# Patient Record
Sex: Male | Born: 1970 | Hispanic: No | Marital: Married | Smoking: Former smoker
Health system: Southern US, Community
[De-identification: ages and names within clinical notes are randomized; demographics above are authoritative.]

## PROBLEM LIST (undated history)

## (undated) DIAGNOSIS — I10 Essential (primary) hypertension: Secondary | ICD-10-CM

## (undated) DIAGNOSIS — J45909 Unspecified asthma, uncomplicated: Secondary | ICD-10-CM

## (undated) DIAGNOSIS — E785 Hyperlipidemia, unspecified: Secondary | ICD-10-CM

## (undated) HISTORY — DX: Essential (primary) hypertension: I10

## (undated) HISTORY — DX: Hyperlipidemia, unspecified: E78.5

## (undated) HISTORY — PX: FOOT SURGERY: SHX648

## (undated) HISTORY — DX: Unspecified asthma, uncomplicated: J45.909

---

## 2016-10-01 DIAGNOSIS — R079 Chest pain, unspecified: Secondary | ICD-10-CM | POA: Diagnosis not present

## 2016-10-01 DIAGNOSIS — R0602 Shortness of breath: Secondary | ICD-10-CM | POA: Diagnosis not present

## 2016-10-01 DIAGNOSIS — I10 Essential (primary) hypertension: Secondary | ICD-10-CM | POA: Diagnosis not present

## 2016-11-19 DIAGNOSIS — R0602 Shortness of breath: Secondary | ICD-10-CM | POA: Diagnosis not present

## 2016-11-19 DIAGNOSIS — R0789 Other chest pain: Secondary | ICD-10-CM | POA: Diagnosis not present

## 2016-11-27 DIAGNOSIS — R0602 Shortness of breath: Secondary | ICD-10-CM | POA: Diagnosis not present

## 2016-11-27 DIAGNOSIS — R079 Chest pain, unspecified: Secondary | ICD-10-CM | POA: Diagnosis not present

## 2016-12-13 DIAGNOSIS — I10 Essential (primary) hypertension: Secondary | ICD-10-CM | POA: Diagnosis not present

## 2016-12-13 DIAGNOSIS — R0602 Shortness of breath: Secondary | ICD-10-CM | POA: Diagnosis not present

## 2016-12-13 DIAGNOSIS — R079 Chest pain, unspecified: Secondary | ICD-10-CM | POA: Diagnosis not present

## 2017-01-11 DIAGNOSIS — M222X1 Patellofemoral disorders, right knee: Secondary | ICD-10-CM | POA: Diagnosis not present

## 2017-01-11 DIAGNOSIS — M25561 Pain in right knee: Secondary | ICD-10-CM | POA: Diagnosis not present

## 2017-01-11 DIAGNOSIS — G8929 Other chronic pain: Secondary | ICD-10-CM | POA: Diagnosis not present

## 2017-01-12 DIAGNOSIS — M222X1 Patellofemoral disorders, right knee: Secondary | ICD-10-CM | POA: Diagnosis not present

## 2017-01-19 DIAGNOSIS — M222X1 Patellofemoral disorders, right knee: Secondary | ICD-10-CM | POA: Diagnosis not present

## 2017-02-02 DIAGNOSIS — M222X1 Patellofemoral disorders, right knee: Secondary | ICD-10-CM | POA: Diagnosis not present

## 2017-06-14 DIAGNOSIS — J3089 Other allergic rhinitis: Secondary | ICD-10-CM | POA: Diagnosis not present

## 2017-06-14 DIAGNOSIS — H1045 Other chronic allergic conjunctivitis: Secondary | ICD-10-CM | POA: Diagnosis not present

## 2017-06-14 DIAGNOSIS — Z91013 Allergy to seafood: Secondary | ICD-10-CM | POA: Diagnosis not present

## 2017-09-07 DIAGNOSIS — I10 Essential (primary) hypertension: Secondary | ICD-10-CM | POA: Diagnosis not present

## 2017-09-07 DIAGNOSIS — E7800 Pure hypercholesterolemia, unspecified: Secondary | ICD-10-CM | POA: Diagnosis not present

## 2017-09-16 DIAGNOSIS — M549 Dorsalgia, unspecified: Secondary | ICD-10-CM | POA: Diagnosis not present

## 2017-10-21 DIAGNOSIS — R200 Anesthesia of skin: Secondary | ICD-10-CM | POA: Diagnosis not present

## 2017-11-05 DIAGNOSIS — K149 Disease of tongue, unspecified: Secondary | ICD-10-CM | POA: Diagnosis not present

## 2017-11-05 DIAGNOSIS — J342 Deviated nasal septum: Secondary | ICD-10-CM | POA: Diagnosis not present

## 2017-11-05 DIAGNOSIS — K130 Diseases of lips: Secondary | ICD-10-CM | POA: Diagnosis not present

## 2017-12-02 DIAGNOSIS — R5383 Other fatigue: Secondary | ICD-10-CM | POA: Diagnosis not present

## 2017-12-02 DIAGNOSIS — K219 Gastro-esophageal reflux disease without esophagitis: Secondary | ICD-10-CM | POA: Diagnosis not present

## 2017-12-02 DIAGNOSIS — E7800 Pure hypercholesterolemia, unspecified: Secondary | ICD-10-CM | POA: Diagnosis not present

## 2017-12-02 DIAGNOSIS — R7303 Prediabetes: Secondary | ICD-10-CM | POA: Diagnosis not present

## 2017-12-02 DIAGNOSIS — Z0000 Encounter for general adult medical examination without abnormal findings: Secondary | ICD-10-CM | POA: Diagnosis not present

## 2017-12-02 DIAGNOSIS — I10 Essential (primary) hypertension: Secondary | ICD-10-CM | POA: Diagnosis not present

## 2018-06-09 DIAGNOSIS — E7800 Pure hypercholesterolemia, unspecified: Secondary | ICD-10-CM | POA: Diagnosis not present

## 2018-06-09 DIAGNOSIS — I10 Essential (primary) hypertension: Secondary | ICD-10-CM | POA: Diagnosis not present

## 2018-08-22 DIAGNOSIS — H9313 Tinnitus, bilateral: Secondary | ICD-10-CM | POA: Diagnosis not present

## 2018-08-22 DIAGNOSIS — H6123 Impacted cerumen, bilateral: Secondary | ICD-10-CM | POA: Diagnosis not present

## 2018-09-15 DIAGNOSIS — H9313 Tinnitus, bilateral: Secondary | ICD-10-CM | POA: Diagnosis not present

## 2018-09-15 DIAGNOSIS — H6123 Impacted cerumen, bilateral: Secondary | ICD-10-CM | POA: Diagnosis not present

## 2018-09-15 DIAGNOSIS — J343 Hypertrophy of nasal turbinates: Secondary | ICD-10-CM | POA: Diagnosis not present

## 2018-09-15 DIAGNOSIS — J342 Deviated nasal septum: Secondary | ICD-10-CM | POA: Diagnosis not present

## 2018-10-31 DIAGNOSIS — Z91013 Allergy to seafood: Secondary | ICD-10-CM | POA: Diagnosis not present

## 2018-10-31 DIAGNOSIS — H1045 Other chronic allergic conjunctivitis: Secondary | ICD-10-CM | POA: Diagnosis not present

## 2018-10-31 DIAGNOSIS — J3089 Other allergic rhinitis: Secondary | ICD-10-CM | POA: Diagnosis not present

## 2018-11-23 ENCOUNTER — Other Ambulatory Visit: Payer: Self-pay | Admitting: Cardiology

## 2018-12-06 DIAGNOSIS — I10 Essential (primary) hypertension: Secondary | ICD-10-CM | POA: Insufficient documentation

## 2018-12-06 DIAGNOSIS — E7800 Pure hypercholesterolemia, unspecified: Secondary | ICD-10-CM | POA: Insufficient documentation

## 2018-12-06 NOTE — Progress Notes (Deleted)
Subjective:  Primary Physician:  No primary care provider on file.  Patient ID: Dale Mooney, male    DOB: 09-Nov-1970, 47 y.o.   MRN: [id number]  No chief complaint on file.   HPI: Dale Mooney  is a 48 y.o. male  from [place]. He has returned for follow-up after 18 months. Patient has been feeling well. No complaints of shortness of breath or chest pain since the last office visit. Patient had history of shortness of breath and chest pain in the past but there are no symptoms now.There is no history of orthopnea or PND. He had normal stress nuclear scans and normal echocardiogram in March 2018.  No complaints of palpitation, dizziness, near-syncope or syncope. No history of swelling on the legs and no claudication.  Patient has hypertension, prediabetes and hypercholesterolemia. He is former smoker, quit smoking in 2004. 3 of his cousins on maternal side died from MI, one in mid 40s, one in 21s and another one at age 60 years. One cousin had CABG at age 53 years. 2 other cousins had stent implants in their late 30s or early 83s. Patient drinks one glass of red wine daily on the week days and more on the weekends. He runs for 2 miles, 3 days a week.  No history of thyroid problems. No history of TIA or CVA.    No past medical history on file.  *** The histories are not reviewed yet. Please review them in the "History" navigator section and refresh this SmartLink.  Social History   Socioeconomic History  . Marital status: Not on file    Spouse name: Not on file  . Number of children: Not on file  . Years of education: Not on file  . Highest education level: Not on file  Occupational History  . Not on file  Social Needs  . Financial resource strain: Not on file  . Food insecurity:    Worry: Not on file    Inability: Not on file  . Transportation needs:    Medical: Not on file    Non-medical: Not on file  Tobacco Use  . Smoking status: Not on file  Substance and Sexual  Activity  . Alcohol use: Not on file  . Drug use: Not on file  . Sexual activity: Not on file  Lifestyle  . Physical activity:    Days per week: Not on file    Minutes per session: Not on file  . Stress: Not on file  Relationships  . Social connections:    Talks on phone: Not on file    Gets together: Not on file    Attends religious service: Not on file    Active member of club or organization: Not on file    Attends meetings of clubs or organizations: Not on file    Relationship status: Not on file  . Intimate partner violence:    Fear of current or ex partner: Not on file    Emotionally abused: Not on file    Physically abused: Not on file    Forced sexual activity: Not on file  Other Topics Concern  . Not on file  Social History Narrative  . Not on file    Current Outpatient Medications on File Prior to Visit  Medication Sig Dispense Refill  . metoprolol succinate (TOPROL-XL) 25 MG 24 hr tablet TAKE 1 TABLET BY MOUTH EVERY DAY 90 tablet 2   No current facility-administered medications on file prior to visit.     ***  Review of Systems  Constitutional: Negative for fever.  HENT: Negative for nosebleeds.   Eyes: Negative for blurred vision.  Respiratory: Negative for cough.   Gastrointestinal: Negative for abdominal pain, nausea and vomiting.  Genitourinary: Negative for dysuria.  Musculoskeletal: Negative for myalgias.  Skin: Negative for itching and rash.  Neurological: Negative for dizziness and loss of consciousness.  Endo/Heme/Allergies: Does not bruise/bleed easily.  Psychiatric/Behavioral: The patient is not nervous/anxious.           Objective:  There were no vitals taken for this visit. There is no height or weight on file to calculate BMI.  ***Physical Exam  Constitutional: He is oriented to person, place, and time. He appears well-developed and well-nourished.  HENT:  Head: Normocephalic and atraumatic.  Eyes: Pupils are equal, round, and reactive  to light. Conjunctivae are normal.  Neck: No JVD present. No thyromegaly present.  Cardiovascular: Normal rate, regular rhythm, normal heart sounds and intact distal pulses. Exam reveals no gallop.  No murmur heard. Pulmonary/Chest: Effort normal and breath sounds normal. He has no wheezes. He has no rales.  Abdominal: Soft. Bowel sounds are normal. He exhibits no mass. There is no abdominal tenderness.  Musculoskeletal:        General: No edema.  Lymphadenopathy:    He has no cervical adenopathy.  Neurological: He is alert and oriented to person, place, and time.  Skin: Skin is warm.  Psychiatric: He has a normal mood and affect.  Nursing note and vitals reviewed.   CARDIAC STUDIES:  Echocardiogram [11/27/2016]: Left ventricle cavity is normal in size. Normal global wall motion. Normal diastolic filling pattern, normal LAP. Calculated EF 65%. Left atrial cavity is normal in size. Trace mitral regurgitation. Trace tricuspid regurgitation. Trace pulmonic regurgitation. Normal echocardiogram. Nuclear stress test [11/17/2016]: 1. The resting electrocardiogram demonstrated normal sinus rhythm, normal resting conduction, no resting arrhythmias and normal rest repolarization. The stress electrocardiogram was normal. Patient exercised on Bruce protocol for 12:35 minutes and achieved 13.85 METS. Stress test terminated due to fatigue and 102% MPHR achieved (Target HR >85%). 2. Myocardial perfusion imaging is normal. Overall left ventricular systolic function was normal without regional wall motion abnormalities. The left ventricular ejection fraction was 64%.    Assessment & Recommendations:   There are no diagnoses linked to this encounter.  Recommendation: *** Blood pressure is fairly controlled with therapy. I have advised him to continue all the present medications.  Primary prevention was again discussed. He was advised to follow ADA, low-salt, low-cholesterol diet. He has followed  the diet well and has lost 11 pounds in past 18 months. Patient was complimented and was encouraged to continue diet. Patient was again advised to quit drinking alcohol, he has been drinking heavily on weekends, he was advised not to drink anything more than one glass of wine. Deleterious health effects of alcohol were explained. I have also explained to him that he is on statin therapy and chances of hepatotoxicity may increase if he continues drinking.  He was encouraged to continue with running and do other exercises. Call us if there are any cardiac problems. Return for follow-up after 6 months. Patient told me that he had complete blood tests at his PCP's office 6 months ago, we will get those results.  Earl Many, MD, [facility] 12/06/2018, 3:33 PM [facility] [place] Pager: [phone] Office: [phone] If no answer Cell [phone]

## 2018-12-08 ENCOUNTER — Ambulatory Visit: Payer: Self-pay | Admitting: Cardiology

## 2019-01-12 ENCOUNTER — Telehealth: Payer: Self-pay

## 2019-02-03 ENCOUNTER — Other Ambulatory Visit: Payer: Self-pay | Admitting: Cardiology

## 2019-02-03 MED ORDER — ATORVASTATIN CALCIUM 40 MG PO TABS: 40.0000 mg | ORAL_TABLET | Freq: Every day | ORAL | 1 refills | Status: DC

## 2019-02-09 ENCOUNTER — Other Ambulatory Visit: Payer: Self-pay | Admitting: Cardiology

## 2019-06-08 ENCOUNTER — Ambulatory Visit (INDEPENDENT_AMBULATORY_CARE_PROVIDER_SITE_OTHER): Payer: 59 | Admitting: Cardiology

## 2019-06-08 ENCOUNTER — Encounter: Payer: Self-pay | Admitting: Cardiology

## 2019-06-08 ENCOUNTER — Other Ambulatory Visit: Payer: Self-pay

## 2019-06-08 VITALS — BP 156/95 | HR 56 | Temp 97.8°F | Ht 70.0 in | Wt 187.0 lb

## 2019-06-08 DIAGNOSIS — E7800 Pure hypercholesterolemia, unspecified: Secondary | ICD-10-CM

## 2019-06-08 DIAGNOSIS — I10 Essential (primary) hypertension: Secondary | ICD-10-CM

## 2019-06-08 DIAGNOSIS — Z8249 Family history of ischemic heart disease and other diseases of the circulatory system: Secondary | ICD-10-CM

## 2019-06-08 MED ORDER — LOSARTAN POTASSIUM-HCTZ 50-12.5 MG PO TABS: 1.0000 | ORAL_TABLET | Freq: Every day | ORAL | 1 refills | Status: DC

## 2019-06-08 NOTE — Progress Notes (Signed)
Primary Physician:  Juluis RainierBarnes, Elizabeth, MD   Patient ID: Dale Mooney, male    DOB: 12/22/1970, 48 y.o.   MRN: [id number]  Subjective:    Chief Complaint  Patient presents with  . Hypertension  . Follow-up    1 year    HPI: Dale Hornmit Daye  is a 48 y.o. male  with hypertension, prediabetes, hyperlipidemia, and former tobacco use.  He has significant family history of CAD.  Last seen 1 year ago by Dr. Sherril CroonVyas. Patient has been feeling well, but has recently noted elevated blood pressure. No complaints of shortness of breath or chest pain since the last office visit. There is no history of orthopnea or PND. He had normal stress nuclear scans and normal echocardiogram in March 2018.  No complaints of palpitation, dizziness, near-syncope or syncope. No history of swelling on the legs and no claudication.  3 of his cousins on maternal side died from MI, one in mid 5330s, one in 1240s and another one at age 47 years. One cousin had CABG at age 47 years. 2 other cousins had stent implants in their late 30s or early 1540s. Patient drinks one glass of red wine daily on the week days and more on the weekends. He tries to run 20 miles per week that he tolerates well.   Past Medical History:  Diagnosis Date  . Asthma   . Hyperlipidemia   . Hypertension     Past Surgical History:  Procedure Laterality Date  . FOOT SURGERY Left age 48    Social History   Socioeconomic History  . Marital status: Married    Spouse name: Not on file  . Number of children: 2  . Years of education: Not on file  . Highest education level: Not on file  Occupational History  . Not on file  Social Needs  . Financial resource strain: Not on file  . Food insecurity    Worry: Not on file    Inability: Not on file  . Transportation needs    Medical: Not on file    Non-medical: Not on file  Tobacco Use  . Smoking status: Former Smoker    Packs/day: 0.50    Years: 15.00    Pack years: 7.50    Types: Cigarettes   Quit date: 2004    Years since quitting: 16.7  . Smokeless tobacco: Never Used  Substance and Sexual Activity  . Alcohol use: Yes  . Drug use: Not Currently  . Sexual activity: Not on file  Lifestyle  . Physical activity    Days per week: Not on file    Minutes per session: Not on file  . Stress: Not on file  Relationships  . Social Musicianconnections    Talks on phone: Not on file    Gets together: Not on file    Attends religious service: Not on file    Active member of club or organization: Not on file    Attends meetings of clubs or organizations: Not on file    Relationship status: Not on file  . Intimate partner violence    Fear of current or ex partner: Not on file    Emotionally abused: Not on file    Physically abused: Not on file    Forced sexual activity: Not on file  Other Topics Concern  . Not on file  Social History Narrative  . Not on file    Review of Systems  Constitution: Negative for decreased appetite, malaise/fatigue, weight  gain and weight loss.  Eyes: Negative for visual disturbance.  Cardiovascular: Negative for chest pain, claudication, dyspnea on exertion, leg swelling, orthopnea, palpitations and syncope.  Respiratory: Negative for hemoptysis and wheezing.   Endocrine: Negative for cold intolerance and heat intolerance.  Hematologic/Lymphatic: Does not bruise/bleed easily.  Skin: Negative for nail changes.  Musculoskeletal: Negative for muscle weakness and myalgias.  Gastrointestinal: Negative for abdominal pain, change in bowel habit, nausea and vomiting.  Neurological: Negative for difficulty with concentration, dizziness, focal weakness and headaches.  Psychiatric/Behavioral: Negative for altered mental status and suicidal ideas.  All other systems reviewed and are negative.     Objective:  Blood pressure (!) 156/95, pulse (!) 56, temperature 97.8 F (36.6 C), height 5\' 10"  (1.778 m), weight 187 lb (84.8 kg), SpO2 100 %. Body mass index is 26.83  kg/m.    Physical Exam  Constitutional: He is oriented to person, place, and time. Vital signs are normal. He appears well-developed and well-nourished.  HENT:  Head: Normocephalic and atraumatic.  Neck: Normal range of motion.  Cardiovascular: Normal rate, regular rhythm, normal heart sounds and intact distal pulses.  Pulmonary/Chest: Effort normal and breath sounds normal. No accessory muscle usage. No respiratory distress.  Abdominal: Soft. Bowel sounds are normal.  Musculoskeletal: Normal range of motion.  Neurological: He is alert and oriented to person, place, and time.  Skin: Skin is warm and dry.  Vitals reviewed.  Radiology: No results found.  Laboratory examination:    No flowsheet data found. No flowsheet data found. Lipid Panel  No results found for: CHOL, TRIG, HDL, CHOLHDL, VLDL, LDLCALC, LDLDIRECT HEMOGLOBIN A1C No results found for: HGBA1C, MPG TSH No results for input(s): TSH in the last 8760 hours.  PRN Meds:. There are no discontinued medications. Current Meds  Medication Sig  . atorvastatin (LIPITOR) 40 MG tablet Take 1 tablet (40 mg total) by mouth daily.  Marland Kitchen levocetirizine (XYZAL) 5 MG tablet Take 1 tablet by mouth every evening.  . metoprolol succinate (TOPROL-XL) 25 MG 24 hr tablet TAKE 1 TABLET BY MOUTH EVERY DAY    Cardiac Studies:   Echocardiogram 11/27/2016: Left ventricle cavity is normal in size. Normal global wall motion. Normal diastolic filling pattern, normal LAP. Calculated EF 65%. Left atrial cavity is normal in size. Trace mitral regurgitation. Trace tricuspid regurgitation. Trace pulmonic regurgitation. Normal echocardiogram.  Exercise sestamibi stress test 11/17/2016: 1. The resting electrocardiogram demonstrated normal sinus rhythm, normal resting conduction, no resting arrhythmias and normal rest repolarization. The stress electrocardiogram was normal. Patient exercised on Bruce protocol for 12:35 minutes and achieved 13.85  METS. Stress test terminated due to fatigue and 102% MPHR achieved (Target HR >85%). 2. Myocardial perfusion imaging is normal. Overall left ventricular systolic function was normal without regional wall motion abnormalities. The left ventricular ejection fraction was 64%.  Assessment:   Primary hypertension - Plan: EKG 12-Lead  Hypercholesteremia  Family history of early CAD  EKG 06/08/2019: Sinus bradycardia at 59 bpm, normal axis, no evidence of ischemia.   Recommendations:   Patient is presently doing well without any complaints today except for recently noted elevated blood pressure.  His blood pressure is also elevated in office.  We will add losartan hydrochlorthiazide 50-12.5 mg daily.  He has not had recent labs, will obtain CBC, CMP, lipids, and TSH in 2 weeks for surveillance and also follow-up on kidney function with addition of losartan.  He has been exercising regularly without exertional difficulty.  Has had negative nuclear stress test in  2018.  I discussed given his family history he will need continued aggressive primary/secondary prevention measures.  We will see him back in 6 weeks for follow-up, but encouraged him to contact me sooner if needed.   *I have discussed this case with Dr. Jacinto Halim and he personally examined the patient and participated in formulating the plan.*   Toniann Fail, MSN, APRN, FNP-C [facility] [place] Office: [phone] Fax: [phone]

## 2019-06-22 ENCOUNTER — Telehealth: Payer: Self-pay

## 2019-06-22 DIAGNOSIS — I10 Essential (primary) hypertension: Secondary | ICD-10-CM

## 2019-06-22 MED ORDER — LOSARTAN POTASSIUM-HCTZ 100-25 MG PO TABS: 1.0000 | ORAL_TABLET | ORAL | 2 refills | Status: DC

## 2019-06-22 NOTE — Telephone Encounter (Signed)
Pt called stating his bp is still elevated about 160' and 977'S systolic; Even with the new medication; Please advise

## 2019-06-22 NOTE — Telephone Encounter (Signed)
I have increased the dose of losartan HCT from 50/12.5 mg to 100/25 mg in the morning.  Prescription has been sent, I think that he can double up on the medications he has at home.  He is advised to obtain the labs that have been ordered anytime within the next 1week to 2 weeks.  To call us if blood pressure is not improved within the next 2 weeks.

## 2019-06-23 NOTE — Telephone Encounter (Signed)
Pt aware.

## 2019-06-23 NOTE — Telephone Encounter (Signed)
See above

## 2019-06-30 ENCOUNTER — Other Ambulatory Visit: Payer: Self-pay | Admitting: Cardiology

## 2019-07-14 LAB — CBC
Hematocrit: 43.7 % (ref 37.5–51.0)
Hemoglobin: 14.5 g/dL (ref 13.0–17.7)
MCH: 28 pg (ref 26.6–33.0)
MCHC: 33.2 g/dL (ref 31.5–35.7)
MCV: 84 fL (ref 79–97)
Platelets: 284 10*3/uL (ref 150–450)
RBC: 5.18 x10E6/uL (ref 4.14–5.80)
RDW: 13.3 % (ref 11.6–15.4)
WBC: 6.7 10*3/uL (ref 3.4–10.8)

## 2019-07-14 LAB — COMPREHENSIVE METABOLIC PANEL
ALT: 24 IU/L (ref 0–44)
AST: 26 IU/L (ref 0–40)
Albumin/Globulin Ratio: 1.9 (ref 1.2–2.2)
Albumin: 4.6 g/dL (ref 4.0–5.0)
Alkaline Phosphatase: 55 IU/L (ref 39–117)
BUN/Creatinine Ratio: 22 — ABNORMAL HIGH (ref 9–20)
BUN: 26 mg/dL — ABNORMAL HIGH (ref 6–24)
Bilirubin Total: 0.5 mg/dL (ref 0.0–1.2)
CO2: 24 mmol/L (ref 20–29)
Calcium: 9.6 mg/dL (ref 8.7–10.2)
Chloride: 98 mmol/L (ref 96–106)
Creatinine, Ser: 1.16 mg/dL (ref 0.76–1.27)
GFR calc Af Amer: 86 mL/min/{1.73_m2} (ref >59)
GFR calc non Af Amer: 75 mL/min/{1.73_m2} (ref >59)
Globulin, Total: 2.4 g/dL (ref 1.5–4.5)
Glucose: 108 mg/dL — ABNORMAL HIGH (ref 65–99)
Potassium: 4.2 mmol/L (ref 3.5–5.2)
Sodium: 135 mmol/L (ref 134–144)
Total Protein: 7 g/dL (ref 6.0–8.5)

## 2019-07-14 LAB — LIPID PANEL
Chol/HDL Ratio: 2.5 ratio (ref 0.0–5.0)
Cholesterol, Total: 149 mg/dL (ref 100–199)
HDL: 59 mg/dL (ref >39)
LDL Chol Calc (NIH): 79 mg/dL (ref 0–99)
Triglycerides: 54 mg/dL (ref 0–149)
VLDL Cholesterol Cal: 11 mg/dL (ref 5–40)

## 2019-07-14 LAB — TSH: TSH: 1.88 u[IU]/mL (ref 0.450–4.500)

## 2019-07-20 ENCOUNTER — Ambulatory Visit: Payer: 59 | Admitting: Cardiology

## 2019-07-23 ENCOUNTER — Other Ambulatory Visit: Payer: Self-pay

## 2019-07-23 ENCOUNTER — Ambulatory Visit (INDEPENDENT_AMBULATORY_CARE_PROVIDER_SITE_OTHER): Payer: 59 | Admitting: Cardiology

## 2019-07-23 ENCOUNTER — Encounter: Payer: Self-pay | Admitting: Cardiology

## 2019-07-23 VITALS — BP 138/91 | HR 64 | Temp 98.4°F | Ht 70.0 in | Wt 187.8 lb

## 2019-07-23 DIAGNOSIS — I10 Essential (primary) hypertension: Secondary | ICD-10-CM

## 2019-07-23 DIAGNOSIS — Z8249 Family history of ischemic heart disease and other diseases of the circulatory system: Secondary | ICD-10-CM

## 2019-07-23 DIAGNOSIS — E7800 Pure hypercholesterolemia, unspecified: Secondary | ICD-10-CM

## 2019-07-23 MED ORDER — AMLODIPINE BESYLATE 5 MG PO TABS: 5.0000 mg | ORAL_TABLET | Freq: Every day | ORAL | 2 refills | Status: DC

## 2019-07-23 NOTE — Progress Notes (Signed)
Primary Physician:  Juluis RainierBarnes, Elizabeth, MD   Patient ID: Dale Mooney, male    DOB: 12/19/1970, 48 y.o.   MRN: [id number]  Subjective:    Chief Complaint  Patient presents with  . Hypertension  . Follow-up    lab results    HPI: Dale Mooney  is a 48 y.o. male  with hypertension, prediabetes, hyperlipidemia, and former tobacco use.  He has significant family history of CAD.  Due to recent elevated blood pressure I had change his losartan to losartan HCT, in spite of this he had called our office stating that his blood pressure is high.  He is presently on losartan HCT 100/25 mg in the morning.  He continues to exercise and run at least 20 miles a week and denies any chest discomfort, shortness of breath.  He had normal stress nuclear scans and normal echocardiogram in March 2018.  No complaints of palpitation, dizziness, near-syncope or syncope. No history of swelling on the legs and no claudication.  3 of his cousins on maternal side died from MI, one in mid 2030s, one in 140s and another one at age 47 years. One cousin had CABG at age 47 years. 2 other cousins had stent implants in their late 30s or early 4940s. Patient drinks one glass of red wine daily on the week days and more on the weekends.   Past Medical History:  Diagnosis Date  . Asthma   . Hyperlipidemia   . Hypertension     Past Surgical History:  Procedure Laterality Date  . FOOT SURGERY Left age 48    Social History   Socioeconomic History  . Marital status: Married    Spouse name: Not on file  . Number of children: 2  . Years of education: Not on file  . Highest education level: Not on file  Occupational History  . Not on file  Social Needs  . Financial resource strain: Not on file  . Food insecurity    Worry: Not on file    Inability: Not on file  . Transportation needs    Medical: Not on file    Non-medical: Not on file  Tobacco Use  . Smoking status: Former Smoker    Packs/day: 0.50    Years: 15.00     Pack years: 7.50    Types: Cigarettes    Quit date: 2004    Years since quitting: 16.8  . Smokeless tobacco: Never Used  Substance and Sexual Activity  . Alcohol use: Yes  . Drug use: Not Currently  . Sexual activity: Not on file  Lifestyle  . Physical activity    Days per week: Not on file    Minutes per session: Not on file  . Stress: Not on file  Relationships  . Social Musicianconnections    Talks on phone: Not on file    Gets together: Not on file    Attends religious service: Not on file    Active member of club or organization: Not on file    Attends meetings of clubs or organizations: Not on file    Relationship status: Not on file  . Intimate partner violence    Fear of current or ex partner: Not on file    Emotionally abused: Not on file    Physically abused: Not on file    Forced sexual activity: Not on file  Other Topics Concern  . Not on file  Social History Narrative  . Not on file  Review of Systems  Constitution: Negative for decreased appetite, malaise/fatigue, weight gain and weight loss.  Eyes: Negative for visual disturbance.  Cardiovascular: Negative for chest pain, claudication, dyspnea on exertion, leg swelling, orthopnea, palpitations and syncope.  Respiratory: Negative for hemoptysis and wheezing.   Endocrine: Negative for cold intolerance and heat intolerance.  Hematologic/Lymphatic: Does not bruise/bleed easily.  Skin: Negative for nail changes.  Musculoskeletal: Negative for muscle weakness and myalgias.  Gastrointestinal: Negative for abdominal pain, change in bowel habit, nausea and vomiting.  Neurological: Negative for difficulty with concentration, dizziness, focal weakness and headaches.  Psychiatric/Behavioral: Negative for altered mental status and suicidal ideas.  All other systems reviewed and are negative.     Objective:   Vitals with BMI 07/23/2019 06/08/2019  Height 5\' 10"  5\' 10"   Weight 187 lbs 13 oz 187 lbs  BMI 26.95 26.83   Systolic 138 156  Diastolic 91 95  Pulse 64 56      Physical Exam  Constitutional: He is oriented to person, place, and time. Vital signs are normal. He appears well-developed and well-nourished.  HENT:  Head: Normocephalic and atraumatic.  Neck: Normal range of motion.  Cardiovascular: Normal rate, regular rhythm, normal heart sounds and intact distal pulses.  Pulmonary/Chest: Effort normal and breath sounds normal. No accessory muscle usage. No respiratory distress.  Abdominal: Soft. Bowel sounds are normal.  Musculoskeletal: Normal range of motion.  Neurological: He is alert and oriented to person, place, and time.  Skin: Skin is warm and dry.  Vitals reviewed.  Radiology: No results found.  Laboratory examination:    CMP Latest Ref Rng & Units 07/13/2019  Glucose 65 - 99 mg/dL )  BUN 6 - 24 mg/dL 07/15/2019)  Creatinine 409(W - 1.27 mg/dL 11(B  Sodium 1.47 - 8.29 mmol/L 135  Potassium 3.5 - 5.2 mmol/L 4.2  Chloride 96 - 106 mmol/L 98  CO2 20 - 29 mmol/L 24  Calcium 8.7 - 10.2 mg/dL 9.6  Total Protein 6.0 - 8.5 g/dL 7.0  Total Bilirubin 0.0 - 1.2 mg/dL 0.5  Alkaline Phos 39 - 117 IU/L 55  AST 0 - 40 IU/L 26  ALT 0 - 44 IU/L 24   CBC Latest Ref Rng & Units 07/13/2019  WBC 3.4 - 10.8 x10E3/uL 6.7  Hemoglobin 13.0 - 17.7 g/dL 130  Hematocrit 07/15/2019 - 51.0 % 43.7  Platelets 150 - 450 x10E3/uL 284   Lipid Panel     Component Value Date/Time   CHOL 149 07/13/2019 0900   TRIG 54 07/13/2019 0900   HDL 59 07/13/2019 0900   CHOLHDL 2.5 07/13/2019 0900   LDLCALC 79 07/13/2019 0900   HEMOGLOBIN A1C No results found for: HGBA1C, MPG TSH Recent Labs    07/13/19 0900  TSH 1.880    PRN Meds:. Medications Discontinued During This Encounter  Medication Reason  . metoprolol succinate (TOPROL-XL) 25 MG 24 hr tablet Error   Current Meds  Medication Sig  . atorvastatin (LIPITOR) 40 MG tablet Take 1 tablet (40 mg total) by mouth daily.  07/15/2019 levocetirizine (XYZAL) 5 MG  tablet Take 1 tablet by mouth every evening.  07/15/19 losartan-hydrochlorothiazide (HYZAAR) 100-25 MG tablet Take 1 tablet by mouth every morning.    Cardiac Studies:   Echocardiogram 11/27/2016: Left ventricle cavity is normal in size. Normal global wall motion. Normal diastolic filling pattern, normal LAP. Calculated EF 65%. Left atrial cavity is normal in size. Trace mitral regurgitation. Trace tricuspid regurgitation. Trace pulmonic regurgitation. Normal echocardiogram.  Exercise sestamibi stress  test 11/17/2016: 1. The resting electrocardiogram demonstrated normal sinus rhythm, normal resting conduction, no resting arrhythmias and normal rest repolarization. The stress electrocardiogram was normal. Patient exercised on Bruce protocol for 12:35 minutes and achieved 13.85 METS. Stress test terminated due to fatigue and 102% MPHR achieved (Target HR >85%). 2. Myocardial perfusion imaging is normal. Overall left ventricular systolic function was normal without regional wall motion abnormalities. The left ventricular ejection fraction was 64%.  Assessment:     ICD-10-CM   1. Primary hypertension  I10 amLODipine (NORVASC) 5 MG tablet  2. Hypercholesteremia  E78.00   3. Family history of early CAD  Z82.49     EKG 06/08/2019: Sinus bradycardia at 59 bpm, normal axis, no evidence of ischemia.   Recommendations:   Patient is here on a 6 week office visit and follow-up of hypertension, hyperlipidemia.  Patient continues to have elevated blood pressure, will add amlodipine 5 mg daily.  I discussed with him regarding hyperglycemia as well.  Lipids are under excellent control.  Labs were reviewed.  Unless he has new symptoms of chest pain or dyspnea or any other cardiac symptoms, I'll see him back in a year for follow-up of hypertension and hyperlipidemia.  In view of strong family history of premature coronary disease we would be aggressive in risk factor modification.  Adrian Prows, MD,  [facility] 07/23/2019, 3:57 PM [facility] [place] Pager: [phone] Office: [phone] If no answer Cell [phone]

## 2019-09-21 ENCOUNTER — Other Ambulatory Visit: Payer: Self-pay | Admitting: Cardiology

## 2019-09-21 DIAGNOSIS — I10 Essential (primary) hypertension: Secondary | ICD-10-CM

## 2019-10-02 ENCOUNTER — Other Ambulatory Visit: Payer: Self-pay | Admitting: Cardiology

## 2019-10-02 DIAGNOSIS — I10 Essential (primary) hypertension: Secondary | ICD-10-CM

## 2020-06-07 ENCOUNTER — Other Ambulatory Visit: Payer: Self-pay | Admitting: Family Medicine

## 2020-06-07 DIAGNOSIS — R221 Localized swelling, mass and lump, neck: Secondary | ICD-10-CM

## 2020-06-09 ENCOUNTER — Other Ambulatory Visit: Payer: Self-pay

## 2020-06-09 ENCOUNTER — Ambulatory Visit (INDEPENDENT_AMBULATORY_CARE_PROVIDER_SITE_OTHER): Payer: Managed Care, Other (non HMO)

## 2020-06-09 DIAGNOSIS — R221 Localized swelling, mass and lump, neck: Secondary | ICD-10-CM

## 2020-07-22 ENCOUNTER — Ambulatory Visit: Payer: 59 | Admitting: Cardiology

## 2020-08-02 ENCOUNTER — Ambulatory Visit (INDEPENDENT_AMBULATORY_CARE_PROVIDER_SITE_OTHER): Payer: Managed Care, Other (non HMO)

## 2020-08-02 ENCOUNTER — Other Ambulatory Visit: Payer: Self-pay | Admitting: Family Medicine

## 2020-08-02 ENCOUNTER — Other Ambulatory Visit: Payer: Self-pay

## 2020-08-02 DIAGNOSIS — R222 Localized swelling, mass and lump, trunk: Secondary | ICD-10-CM | POA: Diagnosis not present

## 2020-08-02 DIAGNOSIS — R2230 Localized swelling, mass and lump, unspecified upper limb: Secondary | ICD-10-CM

## 2020-09-16 ENCOUNTER — Emergency Department: Admit: 2020-09-16 | Discharge status: Absent without leave | Payer: Self-pay

## 2020-10-04 ENCOUNTER — Other Ambulatory Visit: Payer: Self-pay | Admitting: Cardiology

## 2020-10-04 DIAGNOSIS — I10 Essential (primary) hypertension: Secondary | ICD-10-CM

## 2020-10-25 ENCOUNTER — Other Ambulatory Visit: Payer: Self-pay | Admitting: Cardiology

## 2020-10-25 DIAGNOSIS — I10 Essential (primary) hypertension: Secondary | ICD-10-CM

## 2020-10-26 ENCOUNTER — Other Ambulatory Visit: Payer: Self-pay

## 2020-10-26 DIAGNOSIS — I10 Essential (primary) hypertension: Secondary | ICD-10-CM

## 2020-10-26 MED ORDER — LOSARTAN POTASSIUM-HCTZ 100-25 MG PO TABS: ORAL_TABLET | ORAL | 3 refills | Status: DC

## 2020-10-26 MED ORDER — ATORVASTATIN CALCIUM 40 MG PO TABS: 40.0000 mg | ORAL_TABLET | Freq: Every day | ORAL | 3 refills | Status: DC

## 2020-11-07 ENCOUNTER — Encounter: Payer: Self-pay | Admitting: Cardiology

## 2020-11-07 ENCOUNTER — Other Ambulatory Visit: Payer: Self-pay

## 2020-11-07 ENCOUNTER — Ambulatory Visit: Payer: 59 | Admitting: Cardiology

## 2020-11-07 VITALS — BP 123/77 | HR 66 | Temp 98.3°F | Resp 17 | Ht 70.0 in | Wt 198.8 lb

## 2020-11-07 DIAGNOSIS — Z8249 Family history of ischemic heart disease and other diseases of the circulatory system: Secondary | ICD-10-CM

## 2020-11-07 DIAGNOSIS — E7800 Pure hypercholesterolemia, unspecified: Secondary | ICD-10-CM

## 2020-11-07 DIAGNOSIS — I10 Essential (primary) hypertension: Secondary | ICD-10-CM

## 2020-11-07 DIAGNOSIS — R739 Hyperglycemia, unspecified: Secondary | ICD-10-CM

## 2020-11-07 NOTE — Progress Notes (Signed)
Primary Physician:  Leighton Ruff, MD   Patient ID: Randen Kauth, male    DOB: Jul 23, 1971, 50 y.o.   MRN: [id number]  Subjective:    Chief Complaint  Patient presents with  . Follow-up  . Hypertension  . Hyperlipidemia    HPI: Oseph Imburgia  is a 50 y.o. male  with hypertension, prediabetes, hyperlipidemia, and former tobacco use.  He has significant family history of CAD, on the maternal side he has had multiple cousins or uncles who have had significant CAD at age <34, with multiple cardiac deaths at age <21.  Due to recent elevated blood pressure I had change his losartan to losartan HCT, in spite of this he had called our office stating that his blood pressure is high.  He is presently on losartan HCT 100/25 mg in the morning.  He continues to exercise and run at least 20 miles a week and denies any chest discomfort, shortness of breath.  He had normal stress nuclear scans and normal echocardiogram in March 2018.  No complaints of palpitation, dizziness, near-syncope or syncope. No history of swelling on the legs and no claudication.  3 of his cousins on maternal side died from MI, one in mid 5s, one in 94s and another one at age 66 years. One cousin had CABG at age 43 years. 2 other cousins had stent implants in their late 59s or early 63s. Patient drinks one glass of red wine daily on the week days and more on the weekends.   Past Medical History:  Diagnosis Date  . Asthma   . Hyperlipidemia   . Hypertension     Past Surgical History:  Procedure Laterality Date  . FOOT SURGERY Left age 81   Social History   Tobacco Use  . Smoking status: Former Smoker    Packs/day: 0.50    Years: 15.00    Pack years: 7.50    Types: Cigarettes    Quit date: 2004    Years since quitting: 18.1  . Smokeless tobacco: Never Used  Substance Use Topics  . Alcohol use: Yes    Alcohol/week: 24.0 standard drinks    Types: 12 Glasses of wine, 12 Standard drinks or equivalent per week     Comment: More than occasionally   Marital Status: Married    Review of Systems  Cardiovascular: Negative for chest pain, dyspnea on exertion and leg swelling.  Gastrointestinal: Negative for melena.   Objective:   Vitals with BMI 11/07/2020 07/23/2019 06/08/2019  Height 5' 10"  5' 10"  5' 10"   Weight 198 lbs 13 oz 187 lbs 13 oz 187 lbs  BMI 28.52 67.20 94.70  Systolic 962 836 629  Diastolic 77 91 95  Pulse 66 64 56      Physical Exam Vitals reviewed.  Constitutional:      Appearance: He is well-developed.  HENT:     Head: Normocephalic and atraumatic.  Cardiovascular:     Rate and Rhythm: Normal rate and regular rhythm.     Pulses: Intact distal pulses.     Heart sounds: Normal heart sounds.  Pulmonary:     Effort: Pulmonary effort is normal. No accessory muscle usage or respiratory distress.     Breath sounds: Normal breath sounds.  Abdominal:     General: Bowel sounds are normal.     Palpations: Abdomen is soft.  Musculoskeletal:        General: Normal range of motion.     Cervical back: Normal range of motion.  Skin:    General: Skin is warm and dry.  Neurological:     Mental Status: He is alert and oriented to person, place, and time.    Radiology: No results found.  Laboratory examination:   CMP Latest Ref Rng & Units 07/13/2019  Glucose 65 - 99 mg/dL 108(H)  BUN 6 - 24 mg/dL 26(H)  Creatinine 0.76 - 1.27 mg/dL 1.16  Sodium 134 - 144 mmol/L 135  Potassium 3.5 - 5.2 mmol/L 4.2  Chloride 96 - 106 mmol/L 98  CO2 20 - 29 mmol/L 24  Calcium 8.7 - 10.2 mg/dL 9.6  Total Protein 6.0 - 8.5 g/dL 7.0  Total Bilirubin 0.0 - 1.2 mg/dL 0.5  Alkaline Phos 39 - 117 IU/L 55  AST 0 - 40 IU/L 26  ALT 0 - 44 IU/L 24   CBC Latest Ref Rng & Units 07/13/2019  WBC 3.4 - 10.8 x10E3/uL 6.7  Hemoglobin 13.0 - 17.7 g/dL 14.5  Hematocrit 37.5 - 51.0 % 43.7  Platelets 150 - 450 x10E3/uL 284   Lipid Panel     Component Value Date/Time   CHOL 149 07/13/2019 0900   TRIG 54  07/13/2019 0900   HDL 59 07/13/2019 0900   CHOLHDL 2.5 07/13/2019 0900   LDLCALC 79 07/13/2019 0900   External labs:   Labs 08/09/2020:  A1c 6.0%.  TSH normal.  Hb 14.5/HCT 41.6, platelets 253, normal indicis.  BUN 13, creatinine 1.05, EGFR 75/91 mL, potassium 4.6, CMP otherwise normal.  Total cholesterol 169, triglycerides 105, HDL 49, NHDL 120, LDL 101.  Current Outpatient Medications on File Prior to Visit  Medication Sig Dispense Refill  . amLODipine (NORVASC) 5 MG tablet TAKE 1 TABLET BY MOUTH EVERY DAY 90 tablet 3  . atorvastatin (LIPITOR) 40 MG tablet Take 1 tablet (40 mg total) by mouth daily. 30 tablet 3  . cetirizine (ZYRTEC) 10 MG tablet Take 1 tablet by mouth daily.    Marland Kitchen ibuprofen (ADVIL) 200 MG tablet Take 1 tablet by mouth as needed.    Marland Kitchen levocetirizine (XYZAL) 5 MG tablet Take 1 tablet by mouth every evening.    Marland Kitchen losartan-hydrochlorothiazide (HYZAAR) 100-25 MG tablet TAKE 1 TABLET BY MOUTH EVERY DAY IN THE MORNING 30 tablet 3   No current facility-administered medications on file prior to visit.    Cardiac Studies:   Echocardiogram 11/27/2016: Left ventricle cavity is normal in size. Normal global wall motion. Normal diastolic filling pattern, normal LAP. Calculated EF 65%. Left atrial cavity is normal in size. Trace mitral regurgitation. Trace tricuspid regurgitation. Trace pulmonic regurgitation. Normal echocardiogram.  Exercise sestamibi stress test 11/17/2016: 1. The resting electrocardiogram demonstrated normal sinus rhythm, normal resting conduction, no resting arrhythmias and normal rest repolarization. The stress electrocardiogram was normal. Patient exercised on Bruce protocol for 12:35 minutes and achieved 13.85 METS. Stress test terminated due to fatigue and 102% MPHR achieved (Target HR >85%). 2. Myocardial perfusion imaging is normal. Overall left ventricular systolic function was normal without regional wall motion abnormalities. The left  ventricular ejection fraction was 64%.  EKG:   EKG 11/07/2020: Normal sinus rhythm at rate of 63 bpm, normal axis.  No evidence of ischemia, normal EKG. no change from 06/08/2019.  Assessment:     ICD-10-CM   1. Essential hypertension  I10 EKG 12-Lead  2. Hypercholesteremia  E78.00 CT CARDIAC SCORING (SELF PAY ONLY)  3. Family history of early CAD  Z47.49 CT CARDIAC SCORING (SELF PAY ONLY)  4. Hyperglycemia  R73.9  Recommendations:   Anna Livers is a 50 y.o.  with hypertension, prediabetes, hyperlipidemia, and former tobacco use.  He has significant family history of CAD, on the maternal side he has had multiple cousins or uncles who have had significant CAD at age <27, with multiple cardiac deaths at age <75.  He remains asymptomatic however he has gained about 10 to 11 pounds in weight and he has not been exercising since pandemic.  I reviewed his external labs, he now has developed clearly hyperglycemia as well and prediabetic state.  Lipids are also increased.  In view of strong family still premature artery disease, I have recommended coronary calcium score for stratification.  With regard to hypertension, on appropriate medical therapy and blood pressure is well controlled.  He still is continuing to exercise but not as avidly as he was doing past year.  We have discussed regarding weight loss and resuming his exercise.  As he remains asymptomatic I have not recommended any other testing unless coronary calcium score comes on to be high risk.  I have a very low threshold to start him on a statin therapy depending upon his coronary calcium score.  Office visit in 1 year.   Adrian Prows, MD, [facility] 11/07/2020, 10:20 AM Office: [phone] Pager: [phone]

## 2020-11-09 ENCOUNTER — Other Ambulatory Visit: Payer: Self-pay | Admitting: Cardiology

## 2020-11-09 DIAGNOSIS — I10 Essential (primary) hypertension: Secondary | ICD-10-CM

## 2020-11-09 NOTE — Telephone Encounter (Signed)
ICD-10-CM   1. Primary hypertension  I10 valsartan-hydrochlorothiazide (DIOVAN HCT) 160-25 MG tablet   Medications Discontinued During This Encounter  Medication Reason  . losartan-hydrochlorothiazide (HYZAAR) 100-25 MG tablet     Meds ordered this encounter  Medications  . valsartan-hydrochlorothiazide (DIOVAN HCT) 160-25 MG tablet    Sig: Take 1 tablet by mouth every morning.    Dispense:  90 tablet    Refill:  0    Losartan Backorder.   Yates Decamp, MD, [facility] 11/09/2020, 11:29 AM Office: [phone] Pager: [phone]

## 2020-11-09 NOTE — Telephone Encounter (Signed)
From pharmacy

## 2020-11-14 ENCOUNTER — Other Ambulatory Visit: Payer: Self-pay | Admitting: Otolaryngology

## 2020-11-14 DIAGNOSIS — R609 Edema, unspecified: Secondary | ICD-10-CM

## 2020-11-28 ENCOUNTER — Other Ambulatory Visit: Payer: 59

## 2020-12-06 ENCOUNTER — Other Ambulatory Visit: Payer: Self-pay

## 2020-12-06 DIAGNOSIS — I10 Essential (primary) hypertension: Secondary | ICD-10-CM

## 2020-12-06 MED ORDER — LOSARTAN POTASSIUM-HCTZ 100-25 MG PO TABS: 1.0000 | ORAL_TABLET | Freq: Every morning | ORAL | 3 refills | Status: DC

## 2020-12-06 NOTE — Telephone Encounter (Signed)
Patient never started the valsartan and the losartan is back available at his pharmacy. I sent him in the losartan and took the valsartan off his list.

## 2020-12-26 ENCOUNTER — Inpatient Hospital Stay: Admission: RE | Admit: 2020-12-26 | Payer: 59 | Source: Ambulatory Visit

## 2020-12-26 ENCOUNTER — Other Ambulatory Visit: Payer: 59

## 2021-01-12 ENCOUNTER — Ambulatory Visit
Admission: RE | Admit: 2021-01-12 | Discharge: 2021-01-12 | Disposition: A | Discharge status: Home / Self Care | Payer: 59 | Source: Ambulatory Visit | Attending: Otolaryngology | Admitting: Otolaryngology

## 2021-01-12 DIAGNOSIS — R609 Edema, unspecified: Secondary | ICD-10-CM

## 2021-01-12 MED ORDER — IOPAMIDOL (ISOVUE-300) INJECTION 61%
75.0000 mL | Freq: Once | INTRAVENOUS | Status: AC | PRN
  Administered 2021-01-12: 75 mL via INTRAVENOUS

## 2021-01-23 ENCOUNTER — Other Ambulatory Visit: Payer: Self-pay | Admitting: Family Medicine

## 2021-01-25 ENCOUNTER — Other Ambulatory Visit: Payer: Self-pay | Admitting: Family Medicine

## 2021-01-25 DIAGNOSIS — R1031 Right lower quadrant pain: Secondary | ICD-10-CM

## 2021-02-22 ENCOUNTER — Other Ambulatory Visit: Payer: Self-pay

## 2021-02-22 MED ORDER — LOSARTAN POTASSIUM-HCTZ 100-25 MG PO TABS: 1.0000 | ORAL_TABLET | Freq: Every morning | ORAL | 3 refills | Status: DC

## 2021-04-23 ENCOUNTER — Other Ambulatory Visit: Payer: Self-pay | Admitting: Cardiology

## 2021-05-09 ENCOUNTER — Other Ambulatory Visit: Payer: Self-pay | Admitting: Student

## 2021-05-09 DIAGNOSIS — Z8249 Family history of ischemic heart disease and other diseases of the circulatory system: Secondary | ICD-10-CM

## 2021-06-02 ENCOUNTER — Ambulatory Visit
Admission: RE | Admit: 2021-06-02 | Discharge: 2021-06-02 | Disposition: A | Discharge status: Home / Self Care | Payer: No Typology Code available for payment source | Source: Ambulatory Visit | Attending: Student | Admitting: Student

## 2021-06-02 DIAGNOSIS — Z8249 Family history of ischemic heart disease and other diseases of the circulatory system: Secondary | ICD-10-CM

## 2021-06-07 NOTE — Progress Notes (Signed)
Please advise his coronary calcium score is elevate, would like to discuss further. Please make appt with [facility] anytime in the next 6 weeks, no urgency.

## 2021-06-07 NOTE — Progress Notes (Signed)
Pt aware. Transferred pt up front to schedule an appt.

## 2021-06-14 ENCOUNTER — Encounter: Payer: Self-pay | Admitting: Cardiology

## 2021-06-14 ENCOUNTER — Ambulatory Visit: Payer: Managed Care, Other (non HMO) | Admitting: Cardiology

## 2021-06-14 ENCOUNTER — Other Ambulatory Visit: Payer: Self-pay

## 2021-06-14 VITALS — BP 127/77 | HR 74 | Temp 98.5°F | Resp 17 | Ht 70.0 in | Wt 198.0 lb

## 2021-06-14 DIAGNOSIS — R931 Abnormal findings on diagnostic imaging of heart and coronary circulation: Secondary | ICD-10-CM

## 2021-06-14 DIAGNOSIS — I10 Essential (primary) hypertension: Secondary | ICD-10-CM

## 2021-06-14 DIAGNOSIS — E7800 Pure hypercholesterolemia, unspecified: Secondary | ICD-10-CM

## 2021-06-14 DIAGNOSIS — R002 Palpitations: Secondary | ICD-10-CM

## 2021-06-14 MED ORDER — PROPRANOLOL HCL 40 MG PO TABS: 40.0000 mg | ORAL_TABLET | Freq: Three times a day (TID) | ORAL | 3 refills | Status: DC | PRN

## 2021-06-14 NOTE — Progress Notes (Signed)
Primary Physician:  Leighton Ruff, MD   Patient ID: Dale Mooney, male    DOB: 11/08/1970, 50 y.o.   MRN: [id number]  Subjective:    Chief Complaint  Patient presents with   Follow-up   Results   with hypertension, prediabetes, hyperlipidemia, and former tobacco use.  He has significant family history of CAD, on the maternal side he has had multiple cousins or uncles who have had significant CAD at age <64, with multiple cardiac deaths at age <63.  This is 76-monthoffice visit, he remains asymptomatic but again has gained weight and has stopped running which he used to enjoy before. No complaints of palpitation, dizziness, near-syncope or syncope.  No history of swelling on the legs and no claudication.  3 of his cousins on maternal side died from MI, one in mmid 67s one in 456sand another one at age 2768years. One cousin had CABG at age 5182years. 2 other cousins had stent implants in their late 390sor early 4110s Patient drinks one glass of red wine daily on the week days and more on the weekends.   Past Medical History:  Diagnosis Date   Asthma    Hyperlipidemia    Hypertension     Past Surgical History:  Procedure Laterality Date   FOOT SURGERY Left age 50  Social History   Tobacco Use   Smoking status: Former    Packs/day: 0.50    Years: 15.00    Pack years: 7.50    Types: Cigarettes    Quit date: 2004    Years since quitting: 18.7   Smokeless tobacco: Never  Substance Use Topics   Alcohol use: Yes    Alcohol/week: 24.0 standard drinks    Types: 12 Glasses of wine, 12 Standard drinks or equivalent per week    Comment: More than occasionally   Marital Status: Married    Review of Systems  Cardiovascular:  Negative for chest pain, dyspnea on exertion and leg swelling.  Gastrointestinal:  Negative for melena.  Objective:   Vitals with BMI 06/14/2021 11/07/2020 07/23/2019  Height 5' 10"  5' 10"  5' 10"   Weight 198 lbs 198 lbs 13 oz 187 lbs 13 oz  BMI 28.41 237.16 296.78 Systolic [id number] Diastolic 77 77 91  Pulse 74 66 64      Physical Exam Vitals reviewed.  Constitutional:      Appearance: He is well-developed.  HENT:     Head: Normocephalic and atraumatic.  Cardiovascular:     Rate and Rhythm: Normal rate and regular rhythm.     Pulses: Intact distal pulses.     Heart sounds: Normal heart sounds.  Pulmonary:     Effort: Pulmonary effort is normal. No accessory muscle usage or respiratory distress.     Breath sounds: Normal breath sounds.  Abdominal:     General: Bowel sounds are normal.     Palpations: Abdomen is soft.  Musculoskeletal:        General: Normal range of motion.     Cervical back: Normal range of motion.  Skin:    General: Skin is warm and dry.  Neurological:     Mental Status: He is alert and oriented to person, place, and time.   Radiology: No results found.  Laboratory examination:   CMP Latest Ref Rng & Units 07/13/2019  Glucose 65 - 99 mg/dL 108(H)  BUN 6 - 24 mg/dL 26(H)  Creatinine 0.76 - 1.27 mg/dL 1.16  Sodium 134 - 144 mmol/L 135  Potassium 3.5 - 5.2 mmol/L 4.2  Chloride 96 - 106 mmol/L 98  CO2 20 - 29 mmol/L 24  Calcium 8.7 - 10.2 mg/dL 9.6  Total Protein 6.0 - 8.5 g/dL 7.0  Total Bilirubin 0.0 - 1.2 mg/dL 0.5  Alkaline Phos 39 - 117 IU/L 55  AST 0 - 40 IU/L 26  ALT 0 - 44 IU/L 24   CBC Latest Ref Rng & Units 07/13/2019  WBC 3.4 - 10.8 x10E3/uL 6.7  Hemoglobin 13.0 - 17.7 g/dL 14.5  Hematocrit 37.5 - 51.0 % 43.7  Platelets 150 - 450 x10E3/uL 284   Lipid Panel     Component Value Date/Time   CHOL 149 07/13/2019 0900   TRIG 54 07/13/2019 0900   HDL 59 07/13/2019 0900   CHOLHDL 2.5 07/13/2019 0900   LDLCALC 79 07/13/2019 0900   External labs:   Labs 08/09/2020:  A1c 6.0%.  TSH normal.  Hb 14.5/HCT 41.6, platelets 253, normal indicis.  BUN 13, creatinine 1.05, EGFR 75/91 mL, potassium 4.6, CMP otherwise normal.  Total cholesterol 169, triglycerides 105, HDL 49, NHDL 120, LDL  101.  Current Outpatient Medications on File Prior to Visit  Medication Sig Dispense Refill   amLODipine (NORVASC) 5 MG tablet TAKE 1 TABLET BY MOUTH EVERY DAY 90 tablet 3   atorvastatin (LIPITOR) 40 MG tablet TAKE 1 TABLET BY MOUTH EVERY DAY 90 tablet 1   cetirizine (ZYRTEC) 10 MG tablet Take 1 tablet by mouth daily.     ibuprofen (ADVIL) 200 MG tablet Take 1 tablet by mouth as needed.     levocetirizine (XYZAL) 5 MG tablet Take 1 tablet by mouth every evening.     losartan-hydrochlorothiazide (HYZAAR) 100-25 MG tablet Take 1 tablet by mouth every morning. 90 tablet 3   No current facility-administered medications on file prior to visit.    Cardiac Studies:   Echocardiogram 11/27/2016: Left ventricle cavity is normal in size. Normal global wall motion. Normal diastolic filling pattern, normal LAP. Calculated EF 65%. Left atrial cavity is normal in size. Trace mitral regurgitation. Trace tricuspid regurgitation. Trace pulmonic regurgitation. Normal echocardiogram.  Exercise sestamibi stress test 11/17/2016: 1. The resting electrocardiogram demonstrated normal sinus rhythm, normal resting conduction, no resting arrhythmias and normal rest repolarization.  The stress electrocardiogram was normal.  Patient exercised on Bruce protocol for 12:35 minutes and achieved 13.85 METS. Stress test terminated due to fatigue and 102% MPHR achieved (Target HR >85%). 2. Myocardial perfusion imaging is normal. Overall left ventricular systolic function was normal without regional wall motion abnormalities. The left ventricular ejection fraction was 64%.  Coronary calcium score 06/02/2021: LM 0 LAD 39 LCx 0 RCA 0 Total Agatston score 39. MESA database percentile: 81 Thoracic aortic size both acing and descending is normal.  No significant extracardiac abnormality.  EKG:   EKG 11/07/2020: Normal sinus rhythm at rate of 63 bpm, normal axis.  No evidence of ischemia, normal EKG. no change from  06/08/2019.  Assessment:     ICD-10-CM   1. Elevated coronary artery calcium score 06/02/21: Total Agatston score 39. MESA database percentile: 84  R93.1     2. Essential hypertension  I10     3. Hypercholesteremia  E78.00 LDL cholesterol, direct    Lipid Panel With LDL/HDL Ratio    Apo A1 + B + Ratio    Lipoprotein A (LPA)    4. Palpitations  R00.2 propranolol (INDERAL) 40 MG tablet       Recommendations:  Romario Tith is a 50 y.o.  with hypertension, prediabetes, hyperlipidemia, and former tobacco use.  He has significant family history of CAD, on the maternal side he has had multiple cousins or uncles who have had significant CAD at age <69, with multiple cardiac deaths at age <9.  This is 52-monthoffice visit, he remains asymptomatic but again has gained weight and has stopped running which he used to enjoy before.  I reviewed the results of his coronary calcium score which places him at high risk at 84th percentile.  Primary prevention discussed in detail with the patient.  Fortunately he is on appropriate medical therapy and his risk of acute CV events is probably much less.  Will obtain lipids including advanced lipids and I will make recommendations regarding his lipid status.  For now continue present medications.  Weight loss was discussed with the patient and regular exercise discussed with the patient.  Blood pressure is well controlled.  No change in his physical exam.  I will see him back in 6 months and if he remains stable I will see him back on a as needed basis.   JAdrian Prows MD, [facility]9/28/2022, 6[place] Office: [phone]: [phone]

## 2021-07-02 ENCOUNTER — Ambulatory Visit: Payer: No Typology Code available for payment source

## 2021-08-31 ENCOUNTER — Telehealth: Payer: Self-pay | Admitting: Cardiology

## 2021-08-31 ENCOUNTER — Other Ambulatory Visit: Payer: Self-pay | Admitting: Cardiology

## 2021-08-31 ENCOUNTER — Other Ambulatory Visit: Payer: Self-pay | Admitting: Pharmacist

## 2021-08-31 DIAGNOSIS — E7800 Pure hypercholesterolemia, unspecified: Secondary | ICD-10-CM

## 2021-08-31 DIAGNOSIS — R002 Palpitations: Secondary | ICD-10-CM

## 2021-08-31 DIAGNOSIS — I10 Essential (primary) hypertension: Secondary | ICD-10-CM

## 2021-08-31 MED ORDER — PROPRANOLOL HCL 40 MG PO TABS: 40.0000 mg | ORAL_TABLET | Freq: Three times a day (TID) | ORAL | 1 refills | Status: DC | PRN

## 2021-08-31 MED ORDER — ATORVASTATIN CALCIUM 40 MG PO TABS: 40.0000 mg | ORAL_TABLET | Freq: Every day | ORAL | 3 refills | Status: DC

## 2021-08-31 MED ORDER — LOSARTAN POTASSIUM-HCTZ 100-25 MG PO TABS: 1.0000 | ORAL_TABLET | Freq: Every morning | ORAL | 3 refills | Status: DC

## 2021-08-31 MED ORDER — AMLODIPINE BESYLATE 5 MG PO TABS: 5.0000 mg | ORAL_TABLET | Freq: Every day | ORAL | 3 refills | Status: DC

## 2021-08-31 NOTE — Telephone Encounter (Signed)
Patient requesting to use online pharmacy and wants to know how to go about doing so. Please call him at [phone].

## 2021-08-31 NOTE — Telephone Encounter (Signed)
Reviewed with pt. Pt requesting filling Rx through [facility] moving forward.

## 2021-11-08 ENCOUNTER — Ambulatory Visit: Payer: 59 | Admitting: Cardiology

## 2021-11-15 IMAGING — CT CT CARDIAC CORONARY ARTERY CALCIUM SCORE
3 series · 14 of 20 positions shown, 16 images · non-contrast
Comparison: None.

CLINICAL DATA: 50-year-old Asian male with history of
hyperlipidemia and family history of heart disease.

EXAM:
CT CARDIAC CORONARY ARTERY CALCIUM SCORE
TECHNIQUE: Non-contrast imaging through the heart was performed using
prospective ECG gating. Image post processing was performed on an
independent workstation, allowing for quantitative analysis of the
heart and coronary arteries. Note that this exam targets the heart
and the chest was not imaged in its entirety.

[Series 2: calcium scoring 2.00 qr36 bestdiast 68% hrt calciu · axial · 0.37mm/px · z∈[+1689,+1771]mm · 4 of 69 slices shown]
[im 14/69  vessel]
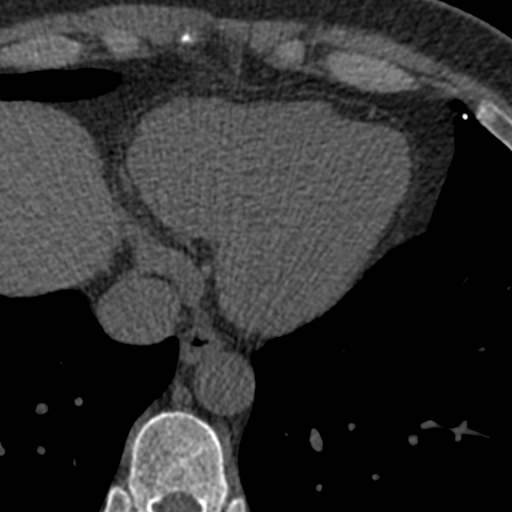
[im 28/69  vessel]
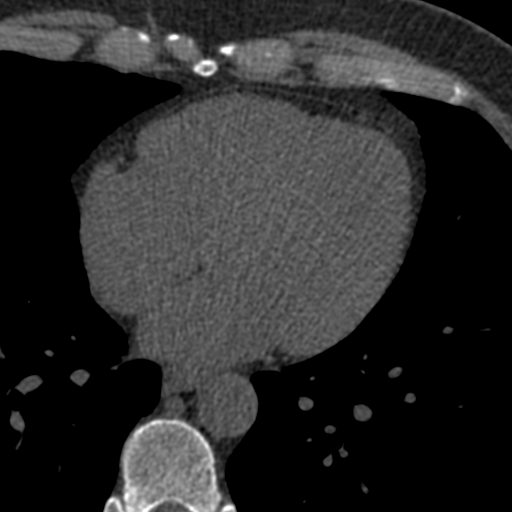
[im 41/69  vessel]
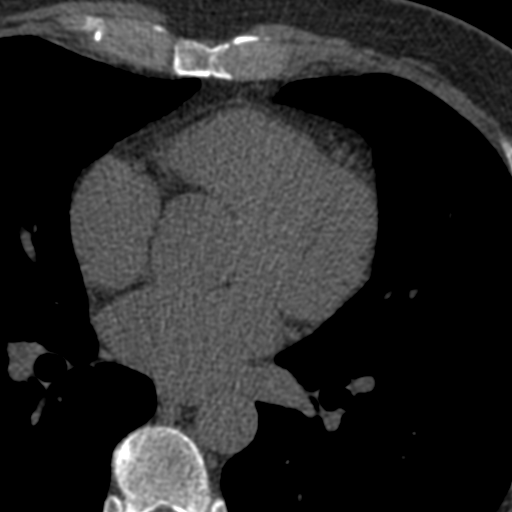
[im 55/69  vessel]
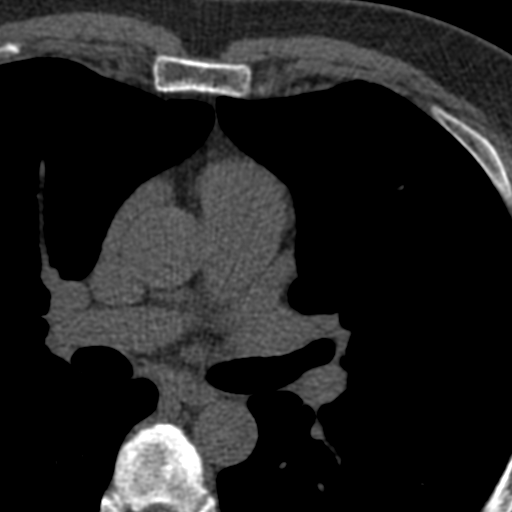

[Series 3: calcium scoring 2.00 br40 bestdiast 68% axial · axial · 0.60mm/px · z∈[+1684,+1776]mm · 5 of 70 slices shown, 7 images]
[im 12/70  vessel]
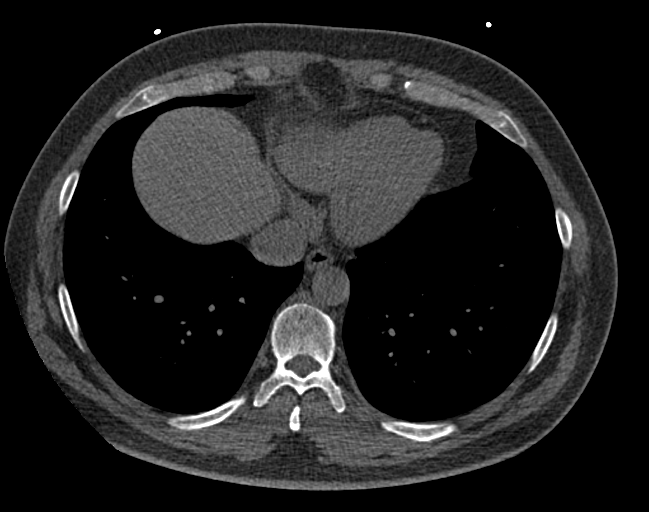
[im 12/70  lung]
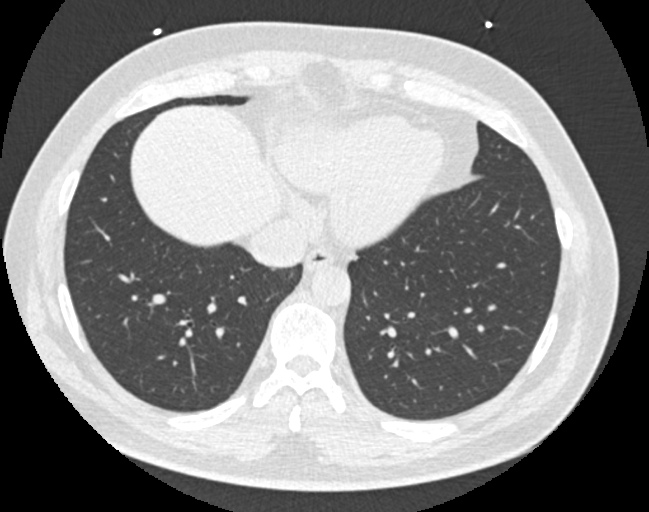
[im 24/70  vessel]
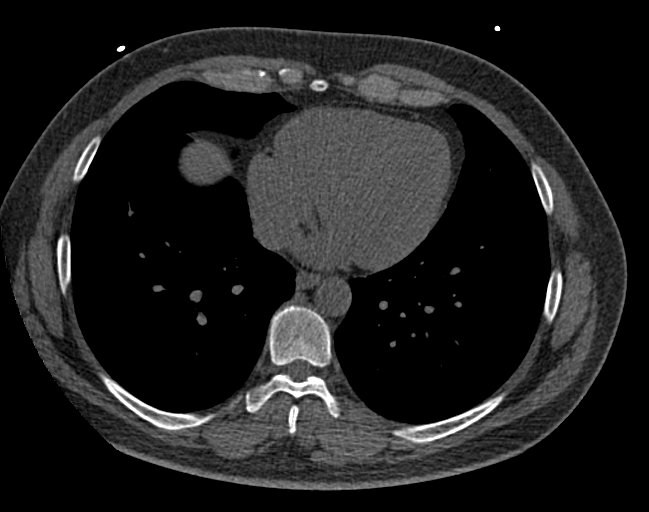
[im 35/70  vessel]
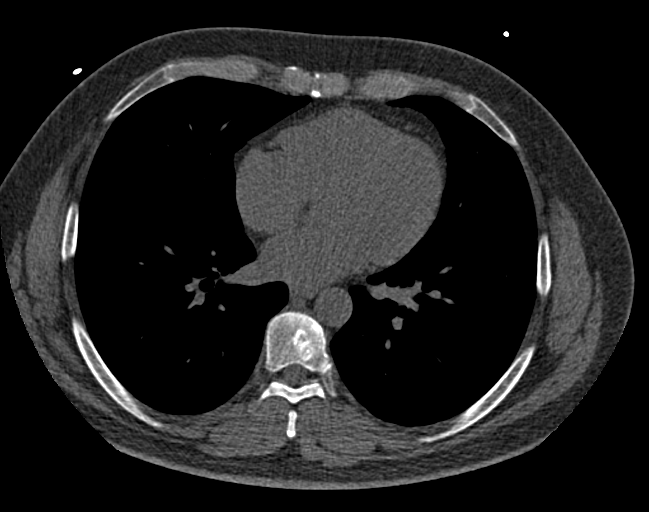
[im 47/70  vessel]
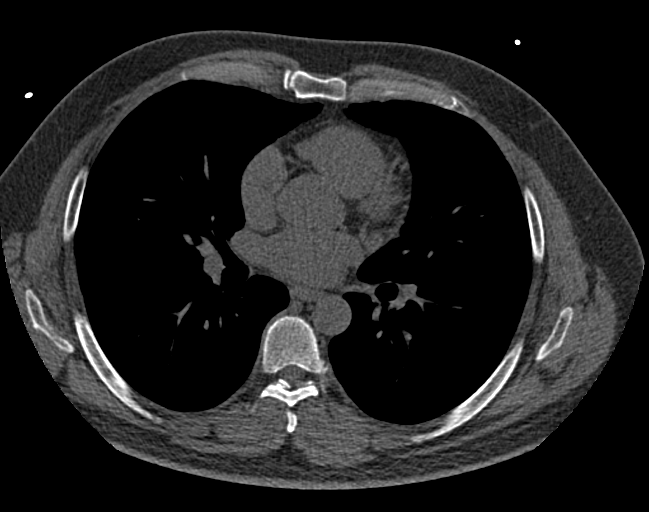
[im 58/70  vessel]
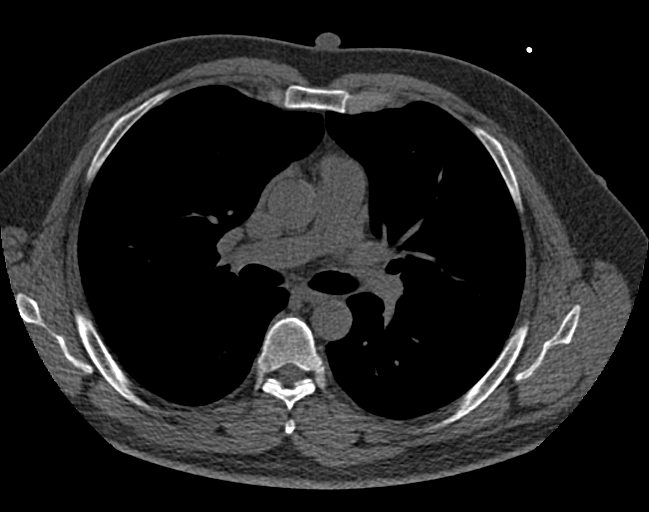
[im 58/70  lung]
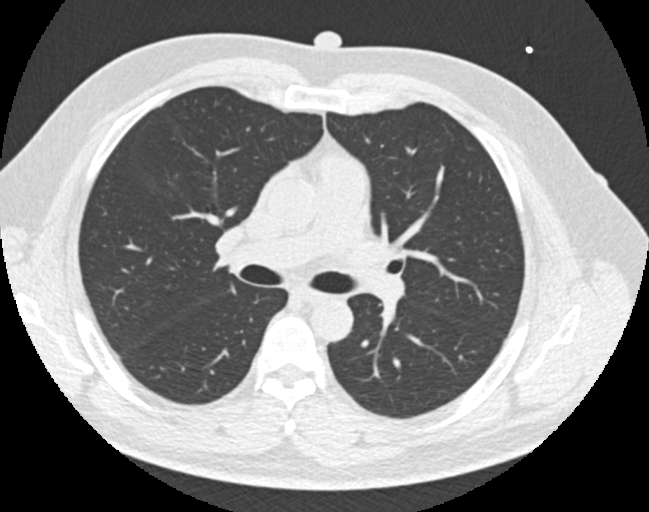

[Series 9: calcium scoring 2.00 br60 bestdiast 68% lungs · axial · 0.60mm/px · z∈[+1684,+1776]mm · 5 of 70 slices shown]
[im 12/70  vessel]
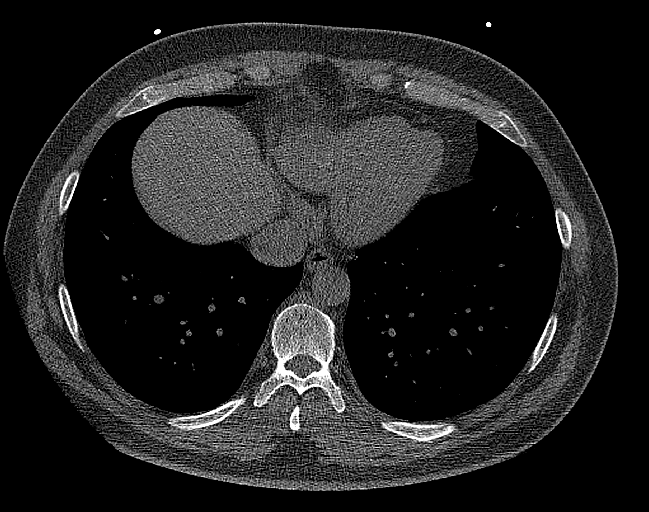
[im 24/70  vessel]
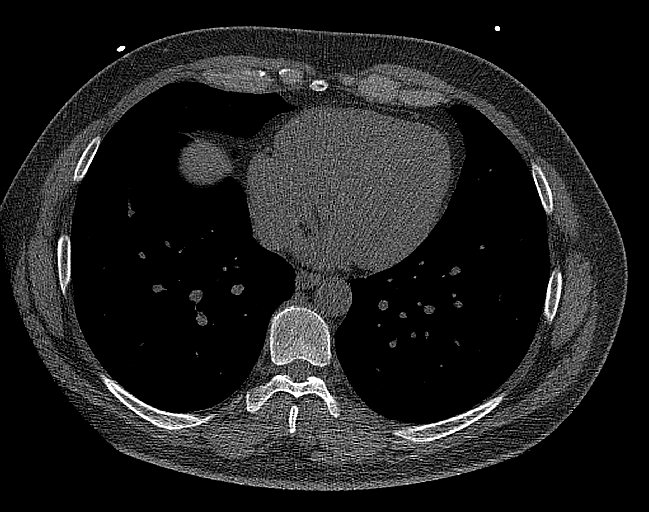
[im 35/70  vessel]
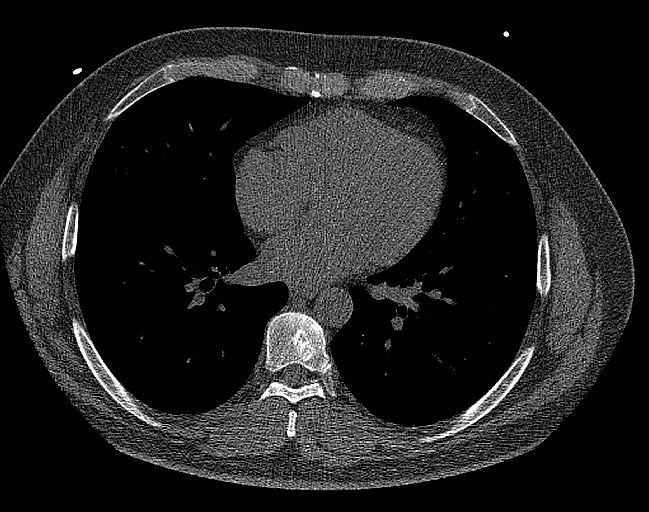
[im 47/70  vessel]
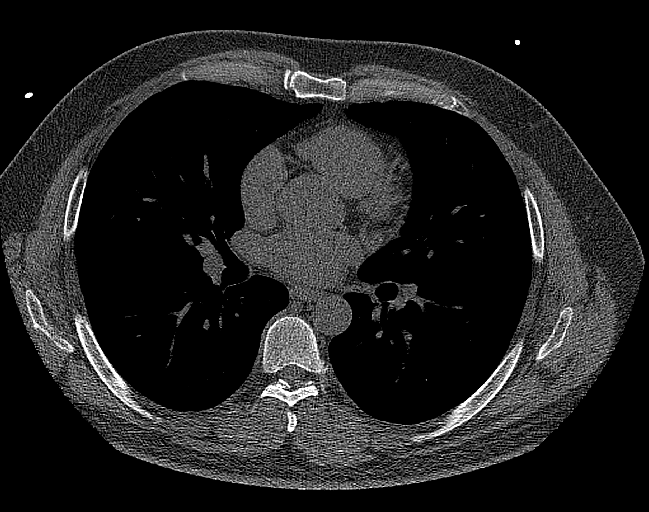
[im 58/70  vessel]
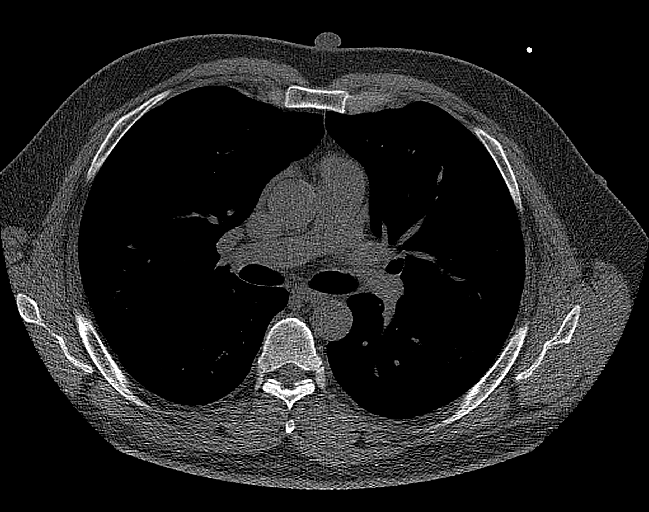

[14 of 20 positions shown; findings below may reference images not displayed]

FINDINGS: CORONARY CALCIUM SCORES:

Left Main: 0

LAD: 39

LCx: 0

RCA: 0

Total Agatston Score: 39

[HOSPITAL] percentile: 84

AORTA MEASUREMENTS:

Ascending Aorta: 30 mm

Descending Aorta: 22 mm

OTHER FINDINGS:

The heart size is within normal limits. No pericardial fluid is
identified. Visualized segments of the thoracic aorta and central
pulmonary arteries are normal in caliber. Visualized mediastinum and
hilar regions demonstrate no lymphadenopathy or masses. Visualized
lungs show no evidence of pulmonary edema, consolidation,
pneumothorax, nodule or pleural fluid. Visualized upper abdomen and
bony structures are unremarkable.
IMPRESSION: Coronary calcium score 39 is at the 84th percentile for the
patient's age, sex and race.

## 2021-12-07 ENCOUNTER — Ambulatory Visit: Payer: Managed Care, Other (non HMO) | Admitting: Cardiology

## 2021-12-20 ENCOUNTER — Encounter: Payer: Self-pay | Admitting: Cardiology

## 2021-12-20 ENCOUNTER — Ambulatory Visit: Payer: Managed Care, Other (non HMO) | Admitting: Cardiology

## 2021-12-20 VITALS — BP 120/77 | HR 60 | Temp 97.9°F | Resp 16 | Ht 70.0 in | Wt 195.8 lb

## 2021-12-20 DIAGNOSIS — I10 Essential (primary) hypertension: Secondary | ICD-10-CM

## 2021-12-20 DIAGNOSIS — R931 Abnormal findings on diagnostic imaging of heart and coronary circulation: Secondary | ICD-10-CM

## 2021-12-20 DIAGNOSIS — E7800 Pure hypercholesterolemia, unspecified: Secondary | ICD-10-CM

## 2021-12-20 NOTE — Progress Notes (Signed)
? ?Primary Physician:  Leighton Ruff, MD (Inactive) ? ? ?Patient ID: Dale Mooney, male    DOB: 11-01-1970, 51 y.o.   MRN: [id number] ? ?Subjective:  ? ? ?Chief Complaint  ?Patient presents with  ? Hypertension  ? Hyperlipidemia  ? Follow-up  ?  1 year ?  ? ?Dale Mooney is a 51 y.o.   with hypertension, prediabetes, hyperlipidemia, and former tobacco use.  He has significant family history of CAD, on the maternal side he has had multiple cousins or uncles who have had significant CAD at age <7, with multiple cardiac deaths at age <37. ? ?This is 89-monthoffice visit, he remains asymptomatic.  ? ?Past Medical History:  ?Diagnosis Date  ? Asthma   ? Hyperlipidemia   ? Hypertension   ? ? ?Past Surgical History:  ?Procedure Laterality Date  ? FOOT SURGERY Left age 51 ? ?Social History  ? ?Tobacco Use  ? Smoking status: Former  ?  Packs/day: 0.50  ?  Years: 15.00  ?  Pack years: 7.50  ?  Types: Cigarettes  ?  Quit date: 2004  ?  Years since quitting: 19.2  ? Smokeless tobacco: Never  ?Substance Use Topics  ? Alcohol use: Yes  ?  Alcohol/week: 24.0 standard drinks  ?  Types: 12 Glasses of wine, 12 Standard drinks or equivalent per week  ?  Comment: More than occasionally   ?Marital Status: Married  ?  ?Review of Systems  ?Cardiovascular:  Negative for chest pain, dyspnea on exertion and leg swelling.  ?Gastrointestinal:  Negative for melena.  ?Objective:  ? ? ?  12/20/2021  ?  2:50 PM 06/14/2021  ?  3:26 PM 11/07/2020  ?  9:23 AM  ?Vitals with BMI  ?Height 5' 10"  5' 10"  5' 10"   ?Weight 195 lbs 13 oz 198 lbs 198 lbs 13 oz  ?BMI 28.09 28.41 28.52  ?Systolic [id number] ?Diastolic 77 77 77  ?Pulse 60 74 66  ?    ?Physical Exam ?Neck:  ?   Vascular: No JVD.  ?Cardiovascular:  ?   Rate and Rhythm: Normal rate and regular rhythm.  ?   Pulses: Intact distal pulses.  ?   Heart sounds: Normal heart sounds. No murmur heard. ?  No gallop.  ?Pulmonary:  ?   Effort: Pulmonary effort is normal.  ?   Breath sounds: Normal breath sounds.   ?Abdominal:  ?   General: Bowel sounds are normal.  ?   Palpations: Abdomen is soft.  ?Musculoskeletal:  ?   Right lower leg: No edema.  ?   Left lower leg: No edema.  ? ?Radiology: ?No results found. ? ?Laboratory examination:  ? ? ?  Latest Ref Rng & Units 07/13/2019  ?  9:00 AM  ?CMP  ?Glucose 65 - 99 mg/dL 108    ?BUN 6 - 24 mg/dL 26    ?Creatinine 0.76 - 1.27 mg/dL 1.16    ?Sodium 134 - 144 mmol/L 135    ?Potassium 3.5 - 5.2 mmol/L 4.2    ?Chloride 96 - 106 mmol/L 98    ?CO2 20 - 29 mmol/L 24    ?Calcium 8.7 - 10.2 mg/dL 9.6    ?Total Protein 6.0 - 8.5 g/dL 7.0    ?Total Bilirubin 0.0 - 1.2 mg/dL 0.5    ?Alkaline Phos 39 - 117 IU/L 55    ?AST 0 - 40 IU/L 26    ?ALT 0 - 44 IU/L 24    ? ? ?  Latest Ref Rng & Units 07/13/2019  ?  9:00 AM  ?CBC  ?WBC 3.4 - 10.8 x10E3/uL 6.7    ?Hemoglobin 13.0 - 17.7 g/dL 14.5    ?Hematocrit 37.5 - 51.0 % 43.7    ?Platelets 150 - 450 x10E3/uL 284    ? ?Lipid Panel  ?   ?Component Value Date/Time  ? CHOL 149 07/13/2019 0900  ? TRIG 54 07/13/2019 0900  ? HDL 59 07/13/2019 0900  ? CHOLHDL 2.5 07/13/2019 0900  ? LDLCALC 79 07/13/2019 0900  ? ?External labs: ? ?Labs 10/26/2021: ? ?BUN 23, creatinine 1.07, potassium 4.3, EGFR 85 mL, AST mildly elevated at 55, ALT normal at 28.  Serum bilirubin normal. ? ?Labs 09/13/2021: ? ?Total cholesterol 167, triglycerides 151, HDL 52, LDL 89. ? ?A1c 6.0%. ? ?Labs 08/09/2020: ? ?A1c 6.0%.  TSH normal. ? ?Hb 14.5/HCT 41.6, platelets 253, normal indicis. ? ?BUN 13, creatinine 1.05, EGFR 75/91 mL, potassium 4.6, CMP otherwise normal. ? ?Total cholesterol 169, triglycerides 105, HDL 49, NHDL 120, LDL 101. ? ?Current Outpatient Medications:  ?  amLODipine (NORVASC) 5 MG tablet, Take 1 tablet (5 mg total) by mouth daily., Disp: 90 tablet, Rfl: 3 ?  atorvastatin (LIPITOR) 40 MG tablet, Take 1 tablet (40 mg total) by mouth daily., Disp: 90 tablet, Rfl: 3 ?  cetirizine (ZYRTEC) 10 MG tablet, Take 1 tablet by mouth daily., Disp: , Rfl:  ?  ibuprofen (ADVIL) 200  MG tablet, Take 1 tablet by mouth as needed., Disp: , Rfl:  ?  levocetirizine (XYZAL) 5 MG tablet, Take 1 tablet by mouth every evening., Disp: , Rfl:  ?  losartan-hydrochlorothiazide (HYZAAR) 100-25 MG tablet, Take 1 tablet by mouth every morning., Disp: 90 tablet, Rfl: 3 ?  propranolol (INDERAL) 40 MG tablet, Take 1 tablet (40 mg total) by mouth 3 (three) times daily as needed. For palpitations, Disp: 270 tablet, Rfl: 1 ? ?  ?Cardiac Studies:  ? ?Echocardiogram 11/27/2016: ?Left ventricle cavity is normal in size. Normal global wall motion. Normal diastolic filling pattern, normal LAP. Calculated EF 65%. ?Left atrial cavity is normal in size. ?Trace mitral regurgitation. ?Trace tricuspid regurgitation. ?Trace pulmonic regurgitation. ?Normal echocardiogram. ? ?Exercise sestamibi stress test 11/17/2016: ?1. The resting electrocardiogram demonstrated normal sinus rhythm, normal resting conduction, no resting arrhythmias and normal rest repolarization.  The stress electrocardiogram was normal.  Patient exercised on Bruce protocol for 12:35 minutes and achieved 13.85 METS. Stress test terminated due to fatigue and 102% MPHR achieved (Target HR >85%). ?2. Myocardial perfusion imaging is normal. Overall left ventricular systolic function was normal without regional wall motion abnormalities. The left ventricular ejection fraction was 64%. ? ?Coronary calcium score 06/02/2021: ?LM 0 ?LAD 39 ?LCx 0 ?RCA 0 ?Total Agatston score 39. MESA database percentile: 45 ?Thoracic aortic size both acing and descending is normal.  No significant extracardiac abnormality. ? ?EKG: ?EKG 12/20/2021: Normal sinus rhythm at the rate of 64 bpm.  Normal EKG. no significant change from 11/07/2020.  ? ?Assessment:  ? ?  ICD-10-CM   ?1. Elevated coronary artery calcium score 06/02/21: Total Agatston score 39. MESA database percentile: 84  R93.1   ?  ?2. Essential hypertension  I10 EKG 12-Lead  ?  ?3. Hypercholesteremia  E78.00   ?  ? ?Recommendations:   ? ?Dale Mooney is a 51 y.o.  with hypertension, prediabetes, hyperlipidemia, and former tobacco use.  He has significant family history of CAD, on the maternal side he has had multiple cousins or uncles who have  had significant CAD at age <65, with multiple cardiac deaths at age <32. ? ?This is 35-monthoffice visit, he remains asymptomatic.  I reviewed the results of his coronary calcium score which places him at high risk at 84th percentile.  Primary prevention discussed in detail with the patient.  Fortunately he is on appropriate medical therapy and his risk of acute CV events is probably much less. ? ?Will obtain lipids including advanced lipids and I will make recommendations regarding his lipid status.  For now continue present medications.  Weight loss was discussed extensively, also mildly elevated LFTs discussed probably related to statin use.  I will also check LFTs with this labs, advised him to obtain the labs in 2 months from now as he plans to incorporate dietary changes that I discussed today.  I also discussed with the patient and regular exercise and to run along with walking and makes them up.  Blood pressure is well controlled.  No change in his physical exam.  I will see him back in 1 year.   ? ?Could consider Jardiance both from cardiovascular standpoint, prediabetes and for weight loss.  We will forward my note to Dr. ELeighton Ruff ? ? ?JAdrian Prows MD, [facility]?12/20/2021, 3:13 PM ?Office: [phone]?Pager: [phone]  ?

## 2022-10-05 ENCOUNTER — Other Ambulatory Visit: Payer: Self-pay

## 2022-10-05 DIAGNOSIS — E7800 Pure hypercholesterolemia, unspecified: Secondary | ICD-10-CM

## 2022-10-05 DIAGNOSIS — I10 Essential (primary) hypertension: Secondary | ICD-10-CM

## 2022-10-05 DIAGNOSIS — R002 Palpitations: Secondary | ICD-10-CM

## 2022-10-05 MED ORDER — LOSARTAN POTASSIUM-HCTZ 100-25 MG PO TABS: 1.0000 | ORAL_TABLET | Freq: Every morning | ORAL | 3 refills | Status: DC

## 2022-10-05 MED ORDER — ATORVASTATIN CALCIUM 40 MG PO TABS: 40.0000 mg | ORAL_TABLET | Freq: Every day | ORAL | 3 refills | Status: DC

## 2022-10-05 MED ORDER — PROPRANOLOL HCL 40 MG PO TABS: 40.0000 mg | ORAL_TABLET | Freq: Three times a day (TID) | ORAL | 1 refills | Status: AC | PRN

## 2022-10-05 MED ORDER — AMLODIPINE BESYLATE 5 MG PO TABS: 5.0000 mg | ORAL_TABLET | Freq: Every day | ORAL | 3 refills | Status: DC

## 2022-12-18 ENCOUNTER — Other Ambulatory Visit: Payer: Self-pay

## 2022-12-18 ENCOUNTER — Telehealth: Payer: Self-pay

## 2022-12-18 NOTE — Telephone Encounter (Signed)
Patient called to ask if he could go to [facility] to have his labs drawn. I informed patient the only lab orders I saw were from April 2023 Patient sad that he spoke with someone today but there was no note in the chart. I called [facility] in [place] and confirmed that labs were released and that they could see the orders.

## 2022-12-20 LAB — HEPATIC FUNCTION PANEL
ALT: 21 IU/L (ref 0–44)
AST: 22 IU/L (ref 0–40)
Albumin: 4.5 g/dL (ref 3.8–4.9)
Alkaline Phosphatase: 69 IU/L (ref 44–121)
Bilirubin Total: 0.5 mg/dL (ref 0.0–1.2)
Bilirubin, Direct: 0.16 mg/dL (ref 0.00–0.40)
Total Protein: 6.9 g/dL (ref 6.0–8.5)

## 2022-12-20 LAB — LIPID PANEL WITH LDL/HDL RATIO
Cholesterol, Total: 150 mg/dL (ref 100–199)
HDL: 49 mg/dL (ref >39)
LDL Chol Calc (NIH): 86 mg/dL (ref 0–99)
LDL/HDL Ratio: 1.8 ratio (ref 0.0–3.6)
Triglycerides: 77 mg/dL (ref 0–149)
VLDL Cholesterol Cal: 15 mg/dL (ref 5–40)

## 2022-12-20 LAB — APO A1 + B + RATIO
Apolipo. B/A-1 Ratio: 0.5 ratio (ref 0.0–0.7)
Apolipoprotein A-1: 142 mg/dL (ref 101–178)
Apolipoprotein B: 77 mg/dL (ref <90)

## 2022-12-20 LAB — LIPOPROTEIN A (LPA): Lipoprotein (a): 30.1 nmol/L (ref <75.0)

## 2022-12-20 LAB — HIGH SENSITIVITY CRP: CRP, High Sensitivity: 0.31 mg/L (ref 0.00–3.00)

## 2022-12-20 LAB — LDL CHOLESTEROL, DIRECT: LDL Direct: 88 mg/dL (ref 0–99)

## 2022-12-20 NOTE — Progress Notes (Signed)
Apolipo. B/A-1 Ratio            0.0 - 0.7 ratio                 0.5 CRP, High Sensitivity           0.00 - 3.00 mg/L     0.31 Lipoprotein (a)                      <75.0 nmol/L                 30.1

## 2022-12-21 ENCOUNTER — Ambulatory Visit: Payer: No Typology Code available for payment source | Admitting: Cardiology

## 2022-12-25 ENCOUNTER — Encounter: Payer: Self-pay | Admitting: Cardiology

## 2022-12-25 ENCOUNTER — Ambulatory Visit: Payer: No Typology Code available for payment source | Admitting: Cardiology

## 2022-12-25 ENCOUNTER — Other Ambulatory Visit: Payer: Self-pay

## 2022-12-25 VITALS — BP 120/80 | HR 66 | Ht 70.0 in | Wt 183.6 lb

## 2022-12-25 DIAGNOSIS — I10 Essential (primary) hypertension: Secondary | ICD-10-CM

## 2022-12-25 DIAGNOSIS — E7800 Pure hypercholesterolemia, unspecified: Secondary | ICD-10-CM

## 2022-12-25 DIAGNOSIS — R931 Abnormal findings on diagnostic imaging of heart and coronary circulation: Secondary | ICD-10-CM

## 2022-12-25 MED ORDER — LOSARTAN POTASSIUM-HCTZ 100-25 MG PO TABS: 1.0000 | ORAL_TABLET | Freq: Every morning | ORAL | 3 refills | Status: DC

## 2022-12-25 MED ORDER — AMLODIPINE BESYLATE 5 MG PO TABS: 5.0000 mg | ORAL_TABLET | Freq: Every day | ORAL | 3 refills | Status: DC

## 2022-12-25 MED ORDER — EZETIMIBE 10 MG PO TABS: 10.0000 mg | ORAL_TABLET | Freq: Every day | ORAL | 3 refills | Status: DC

## 2022-12-25 MED ORDER — ATORVASTATIN CALCIUM 40 MG PO TABS: 40.0000 mg | ORAL_TABLET | Freq: Every day | ORAL | 3 refills | Status: DC

## 2022-12-25 NOTE — Progress Notes (Unsigned)
Primary Physician:  Fatima Sanger, MD   Patient ID: Dale Mooney, male    DOB: Feb 01, 1971, 52 y.o.   MRN: [id number]  Subjective:    Chief Complaint  Patient presents with   Elevated coronary artery calcium score   Follow-up   Medication Refill   Dale Mooney is a 52 y.o.  with hypertension, prediabetes, hyperlipidemia, and former tobacco use.  He has strong family history of premature coronary disease, on the maternal side he has had multiple cousins or uncles who have had significant CAD at age <71, with multiple cardiac deaths at age <70.   This is 40-month office visit, he remains asymptomatic.   Past Medical History:  Diagnosis Date   Asthma    Hyperlipidemia    Hypertension     Past Surgical History:  Procedure Laterality Date   FOOT SURGERY Left age 52   Social History   Tobacco Use   Smoking status: Former    Packs/day: 0.50    Years: 15.00    Additional pack years: 0.00    Total pack years: 7.50    Types: Cigarettes    Quit date: 2004    Years since quitting: 20.2   Smokeless tobacco: Never  Substance Use Topics   Alcohol use: Yes    Alcohol/week: 24.0 standard drinks of alcohol    Types: 12 Glasses of wine, 12 Standard drinks or equivalent per week    Comment: More than occasionally   Marital Status: Married    Review of Systems  Cardiovascular:  Negative for chest pain, dyspnea on exertion and leg swelling.  Gastrointestinal:  Negative for melena.   Objective:      12/25/2022    4:08 PM 12/25/2022    3:54 PM 12/20/2021    2:50 PM  Vitals with BMI  Height  5\' 10"  5\' 10"   Weight  183 lbs 10 oz 195 lbs 13 oz  BMI  26.34 28.09  Systolic 120 131 665  Diastolic 80 90 77  Pulse  66 60      Physical Exam Neck:     Vascular: No JVD.  Cardiovascular:     Rate and Rhythm: Normal rate and regular rhythm.     Pulses: Intact distal pulses.     Heart sounds: Normal heart sounds. No murmur heard.    No gallop.  Pulmonary:     Effort: Pulmonary effort is  normal.     Breath sounds: Normal breath sounds.  Abdominal:     General: Bowel sounds are normal.     Palpations: Abdomen is soft.  Musculoskeletal:     Right lower leg: No edema.     Left lower leg: No edema.    Radiology: No results found.  Laboratory examination:      Latest Ref Rng & Units 12/19/2022    8:40 AM 07/13/2019    9:00 AM  CMP  Glucose 65 - 99 mg/dL  993   BUN 6 - 24 mg/dL  26   Creatinine 5.70 - 1.27 mg/dL  1.77   Sodium 939 - 030 mmol/L  135   Potassium 3.5 - 5.2 mmol/L  4.2   Chloride 96 - 106 mmol/L  98   CO2 20 - 29 mmol/L  24   Calcium 8.7 - 10.2 mg/dL  9.6   Total Protein 6.0 - 8.5 g/dL 6.9  7.0   Total Bilirubin 0.0 - 1.2 mg/dL 0.5  0.5   Alkaline Phos 44 - 121 IU/L 69  55  AST 0 - 40 IU/L 22  26   ALT 0 - 44 IU/L 21  24       Latest Ref Rng & Units 07/13/2019    9:00 AM  CBC  WBC 3.4 - 10.8 x10E3/uL 6.7   Hemoglobin 13.0 - 17.7 g/dL 67.1   Hematocrit 24.5 - 51.0 % 43.7   Platelets 150 - 450 x10E3/uL 284    Lipid Panel     Component Value Date/Time   CHOL 150 12/19/2022 0840   TRIG 77 12/19/2022 0840   HDL 49 12/19/2022 0840   CHOLHDL 2.5 07/13/2019 0900   LDLCALC 86 12/19/2022 0840   LDLDIRECT 88 12/19/2022 0840   Apolipo. B/A-1 Ratio            0.0 - 0.7 ratio                 0.5 CRP, High Sensitivity           0.00 - 3.00 mg/L                 0.31 Lipoprotein (a)                      <75.0 nmol/L                 30.1  External labs:  Labs 10/26/2021:  BUN 23, creatinine 1.07, potassium 4.3, EGFR 85 mL, AST mildly elevated at 55, ALT normal at 28.  Serum bilirubin normal.  Labs 09/13/2021:  Total cholesterol 167, triglycerides 151, HDL 52, LDL 89.  A1c 6.0%.   Cardiac Studies:   Echocardiogram 11/27/2016: Left ventricle cavity is normal in size. Normal global wall motion. Normal diastolic filling pattern, normal LAP. Calculated EF 65%. Left atrial cavity is normal in size. Trace mitral regurgitation. Trace tricuspid  regurgitation. Trace pulmonic regurgitation. Normal echocardiogram.  Exercise sestamibi stress test 11/17/2016: 1. The resting electrocardiogram demonstrated normal sinus rhythm, normal resting conduction, no resting arrhythmias and normal rest repolarization.  The stress electrocardiogram was normal.  Patient exercised on Bruce protocol for 12:35 minutes and achieved 13.85 METS. Stress test terminated due to fatigue and 102% MPHR achieved (Target HR >85%). 2. Myocardial perfusion imaging is normal. Overall left ventricular systolic function was normal without regional wall motion abnormalities. The left ventricular ejection fraction was 64%.  Coronary calcium score 06/02/2021: LM 0 LAD 39 LCx 0 RCA 0 Total Agatston score 39. MESA database percentile: 81 Thoracic aortic size both acing and descending is normal.  No significant extracardiac abnormality.  EKG:  EKG 12/25/2022: Normal sinus rhythm at rate of 62 bpm, normal EKG.    Medications and allergies   No Known Allergies    Current Outpatient Medications:    cetirizine (ZYRTEC) 10 MG tablet, Take 1 tablet by mouth daily., Disp: , Rfl:    ezetimibe (ZETIA) 10 MG tablet, Take 1 tablet (10 mg total) by mouth daily., Disp: 90 tablet, Rfl: 3   ibuprofen (ADVIL) 200 MG tablet, Take 1 tablet by mouth as needed., Disp: , Rfl:    levocetirizine (XYZAL) 5 MG tablet, Take 1 tablet by mouth every evening., Disp: , Rfl:    propranolol (INDERAL) 40 MG tablet, Take 1 tablet (40 mg total) by mouth 3 (three) times daily as needed. For palpitations, Disp: 270 tablet, Rfl: 1   amLODipine (NORVASC) 5 MG tablet, Take 1 tablet (5 mg total) by mouth daily., Disp: 90 tablet, Rfl: 3   atorvastatin (LIPITOR) 40 MG tablet,  Take 1 tablet (40 mg total) by mouth daily., Disp: 90 tablet, Rfl: 3   losartan-hydrochlorothiazide (HYZAAR) 100-25 MG tablet, Take 1 tablet by mouth every morning., Disp: 90 tablet, Rfl: 3   Assessment:     ICD-10-CM   1. Elevated  coronary artery calcium score 06/02/2021: Total Agatston score 39. MESA database percentile: 84  R93.1 EKG 12-Lead    atorvastatin (LIPITOR) 40 MG tablet    2. Primary hypertension  I10 amLODipine (NORVASC) 5 MG tablet    losartan-hydrochlorothiazide (HYZAAR) 100-25 MG tablet    Hepatic function panel    3. Hypercholesteremia  E78.00 atorvastatin (LIPITOR) 40 MG tablet    ezetimibe (ZETIA) 10 MG tablet    Lipid Panel With LDL/HDL Ratio     Recommendations:   Dale Mooney is a 52 y.o.  with hypertension, prediabetes, hyperlipidemia, and former tobacco use.  He has strong family history of premature coronary disease, on the maternal side he has had multiple cousins or uncles who have had significant CAD at age 74<40, with multiple cardiac deaths at age 75<50.  This is 6-week office visit, he remains asymptomatic.   1. Elevated coronary artery calcium score 06/02/2021: Total Agatston score 39. MESA database percentile: 5184  Patient is on appropriate medical therapy and on high intensity statins, lipids are well-controlled, Lp(a) is normal.  Continue present medical therapy. - EKG 12-Lead - atorvastatin (LIPITOR) 40 MG tablet; Take 1 tablet (40 mg total) by mouth daily.  Dispense: 90 tablet; Refill: 3  2. Primary hypertension Blood pressure is well-controlled.  Advised him to continue to monitor his diastolic blood pressure and if it is >80 mmHg to contact us we could certainly increase his amlodipine dose to 10 mg daily. - Hepatic function panel - amLODipine (NORVASC) 5 MG tablet; Take 1 tablet (5 mg total) by mouth daily.  Dispense: 90 tablet; Refill: 3 - losartan-hydrochlorothiazide (HYZAAR) 100-25 MG tablet; Take 1 tablet by mouth every morning.  Dispense: 90 tablet; Refill: 3  3. Hypercholesteremia Reviewed his lipids, LDL is not at target in view of high coronary calcium score.  Will add Zetia 10 mg daily.  Will obtain lipid profile testing in a couple months. - atorvastatin (LIPITOR) 40 MG  tablet; Take 1 tablet (40 mg total) by mouth daily.  Dispense: 90 tablet; Refill: 3 - ezetimibe (ZETIA) 10 MG tablet; Take 1 tablet (10 mg total) by mouth daily.  Dispense: 90 tablet; Refill: 3 - Lipid Panel With LDL/HDL Ratio   Office visit in 1 year or sooner.  Yates DecampJay Robet Crutchfield, MD, [facility] 12/26/2022, 8:55 PM Office: [phone] Pager: [phone]

## 2023-02-04 ENCOUNTER — Encounter: Payer: Self-pay | Admitting: Cardiology

## 2023-02-04 ENCOUNTER — Ambulatory Visit: Payer: No Typology Code available for payment source | Admitting: Cardiology

## 2023-02-04 VITALS — BP 117/73 | HR 65 | Ht 70.0 in | Wt 188.0 lb

## 2023-02-04 DIAGNOSIS — R002 Palpitations: Secondary | ICD-10-CM

## 2023-02-04 DIAGNOSIS — R931 Abnormal findings on diagnostic imaging of heart and coronary circulation: Secondary | ICD-10-CM

## 2023-02-04 DIAGNOSIS — R253 Fasciculation: Secondary | ICD-10-CM

## 2023-02-04 NOTE — Progress Notes (Signed)
Primary Physician:  Fatima Sanger, MD   Patient ID: Dale Mooney, male    DOB: [birth year], 52 y.o.   MRN: [id number]  Subjective:    Chief Complaint  Patient presents with   Palpitations   Elevated coronary artery calcium score 06/02/2021: Total Ag   Primary hypertension   Follow-up   Dale Mooney is a 52 y.o.  with hypertension, prediabetes, hyperlipidemia, and former tobacco use.  He has strong family history of premature coronary disease, on the maternal side he has had multiple cousins or uncles who have had significant CAD at age <81, with multiple cardiac deaths at age <13.   Patient made an appointment to see me as he was having "vibration" like feeling in the left part of the chest with exercise.  Lasting few seconds.  No chest pain.  Past Medical History:  Diagnosis Date   Asthma    Hyperlipidemia    Hypertension     Past Surgical History:  Procedure Laterality Date   FOOT SURGERY Left age 44   Social History   Tobacco Use   Smoking status: Former    Packs/day: 0.50    Years: 15.00    Additional pack years: 0.00    Total pack years: 7.50    Types: Cigarettes    Quit date: 2004    Years since quitting: 20.3   Smokeless tobacco: Never  Substance Use Topics   Alcohol use: Yes    Alcohol/week: 24.0 standard drinks of alcohol    Types: 12 Glasses of wine, 12 Standard drinks or equivalent per week    Comment: More than occasionally   Marital Status: Married    Review of Systems  Cardiovascular:  Negative for chest pain, dyspnea on exertion and leg swelling.  Gastrointestinal:  Negative for melena.   Objective:      02/04/2023    2:48 PM 12/25/2022    4:08 PM 12/25/2022    3:54 PM  Vitals with BMI  Height 5\' 10"   5\' 10"   Weight 188 lbs  183 lbs 10 oz  BMI 26.98  26.34  Systolic 117 120 409  Diastolic 73 80 90  Pulse 65  66      Physical Exam Neck:     Vascular: No JVD.  Cardiovascular:     Rate and Rhythm: Normal rate and regular rhythm.     Pulses:  Intact distal pulses.     Heart sounds: Normal heart sounds. No murmur heard.    No gallop.  Pulmonary:     Effort: Pulmonary effort is normal.     Breath sounds: Normal breath sounds.  Abdominal:     General: Bowel sounds are normal.     Palpations: Abdomen is soft.  Musculoskeletal:     Right lower leg: No edema.     Left lower leg: No edema.    Radiology: No results found.  Laboratory examination:      Latest Ref Rng & Units 12/19/2022    8:40 AM 07/13/2019    9:00 AM  CMP  Glucose 65 - 99 mg/dL  811   BUN 6 - 24 mg/dL  26   Creatinine 9.14 - 1.27 mg/dL  7.82   Sodium 956 - 213 mmol/L  135   Potassium 3.5 - 5.2 mmol/L  4.2   Chloride 96 - 106 mmol/L  98   CO2 20 - 29 mmol/L  24   Calcium 8.7 - 10.2 mg/dL  9.6   Total Protein 6.0 - 8.5  g/dL 6.9  7.0   Total Bilirubin 0.0 - 1.2 mg/dL 0.5  0.5   Alkaline Phos 44 - 121 IU/L 69  55   AST 0 - 40 IU/L 22  26   ALT 0 - 44 IU/L 21  24       Latest Ref Rng & Units 07/13/2019    9:00 AM  CBC  WBC 3.4 - 10.8 x10E3/uL 6.7   Hemoglobin 13.0 - 17.7 g/dL 82.9   Hematocrit 56.2 - 51.0 % 43.7   Platelets 150 - 450 x10E3/uL 284    Lipid Panel     Component Value Date/Time   CHOL 150 12/19/2022 0840   TRIG 77 12/19/2022 0840   HDL 49 12/19/2022 0840   CHOLHDL 2.5 07/13/2019 0900   LDLCALC 86 12/19/2022 0840   LDLDIRECT 88 12/19/2022 0840   Apolipo. B/A-1 Ratio            0.0 - 0.7 ratio                 0.5 CRP, High Sensitivity           0.00 - 3.00 mg/L                 0.31 Lipoprotein (a)                      <75.0 nmol/L                 30.1  External labs:  Labs 10/26/2021:  BUN 23, creatinine 1.07, potassium 4.3, EGFR 85 mL, AST mildly elevated at 55, ALT normal at 28.  Serum bilirubin normal.  Labs 09/13/2021:  Total cholesterol 167, triglycerides 151, HDL 52, LDL 89.  A1c 6.0%.   Cardiac Studies:   Echocardiogram 11/27/2016: Left ventricle cavity is normal in size. Normal global wall motion. Normal  diastolic filling pattern, normal LAP. Calculated EF 65%. Left atrial cavity is normal in size. Trace mitral regurgitation. Trace tricuspid regurgitation. Trace pulmonic regurgitation. Normal echocardiogram.  Exercise sestamibi stress test 11/17/2016: 1. The resting electrocardiogram demonstrated normal sinus rhythm, normal resting conduction, no resting arrhythmias and normal rest repolarization.  The stress electrocardiogram was normal.  Patient exercised on Bruce protocol for 12:35 minutes and achieved 13.85 METS. Stress test terminated due to fatigue and 102% MPHR achieved (Target HR >85%). 2. Myocardial perfusion imaging is normal. Overall left ventricular systolic function was normal without regional wall motion abnormalities. The left ventricular ejection fraction was 64%.  Coronary calcium score 06/02/2021: LM 0 LAD 39 LCx 0 RCA 0 Total Agatston score 39. MESA database percentile: 75 Thoracic aortic size both acing and descending is normal.  No significant extracardiac abnormality.  EKG:  EKG 02/04/2023: Normal sinus rhythm at rate of 64 bpm, normal EKG.  No change from 12/25/2022.  Medications and allergies   No Known Allergies    Current Outpatient Medications:    amLODipine (NORVASC) 5 MG tablet, Take 1 tablet (5 mg total) by mouth daily., Disp: 90 tablet, Rfl: 3   atorvastatin (LIPITOR) 40 MG tablet, Take 1 tablet (40 mg total) by mouth daily., Disp: 90 tablet, Rfl: 3   cetirizine (ZYRTEC) 10 MG tablet, Take 1 tablet by mouth daily., Disp: , Rfl:    ezetimibe (ZETIA) 10 MG tablet, Take 1 tablet (10 mg total) by mouth daily., Disp: 90 tablet, Rfl: 3   ibuprofen (ADVIL) 200 MG tablet, Take 1 tablet by mouth as needed., Disp: , Rfl:  levocetirizine (XYZAL) 5 MG tablet, Take 1 tablet by mouth every evening., Disp: , Rfl:    losartan-hydrochlorothiazide (HYZAAR) 100-25 MG tablet, Take 1 tablet by mouth every morning., Disp: 90 tablet, Rfl: 3   propranolol (INDERAL) 40 MG  tablet, Take 1 tablet (40 mg total) by mouth 3 (three) times daily as needed. For palpitations, Disp: 270 tablet, Rfl: 1   Assessment:     ICD-10-CM   1. Palpitations  R00.2 EKG 12-Lead    PCV CARDIAC STRESS TEST    2. Muscular fasciculation  R25.3 PCV CARDIAC STRESS TEST    3. Elevated coronary artery calcium score 06/02/2021: Total Agatston score 39. MESA database percentile: 84  R93.1 PCV CARDIAC STRESS TEST     Recommendations:   Dale Mooney is a 52 y.o.  with hypertension, prediabetes, hyperlipidemia, and former tobacco use.  He has strong family history of premature coronary disease, on the maternal side he has had multiple cousins or uncles who have had significant CAD at age <58, with multiple cardiac deaths at age <14.  1. Palpitations Patient made an appointment to see me as he was having "vibration" like feeling in the left part of the chest with exercise.  Lasting few seconds.  No chest pain.  Physical exam and EKG are normal.  - EKG 12-Lead - PCV CARDIAC STRESS TEST; Future  2. Muscular fasciculation Suspect his symptoms are related to muscular fasciculations.  Patient extremely concerned and anxious, as he has had a stress test >5 years ago, I will schedule him for a routine treadmill exercise stress test. - PCV CARDIAC STRESS TEST; Future  3. Elevated coronary artery calcium score 06/02/2021: Total Agatston score 39. MESA database percentile: 20   Patient is on appropriate medical therapy and on high intensity statins, lipids are well-controlled, Lp(a) is normal.  Continue present medical therapy. - EKG 12-Lead - PCV CARDIAC STRESS TEST; Future  Office visit in 1 year or sooner.   Yates Decamp, MD, [facility] 02/04/2023, 8:07 PM Office: [phone] Pager: [phone]

## 2023-03-06 ENCOUNTER — Encounter: Payer: Self-pay | Admitting: Cardiology

## 2023-03-06 NOTE — Telephone Encounter (Signed)
From pt

## 2023-03-07 ENCOUNTER — Ambulatory Visit: Payer: No Typology Code available for payment source

## 2023-04-05 ENCOUNTER — Other Ambulatory Visit: Payer: Self-pay | Admitting: Cardiology

## 2023-04-05 DIAGNOSIS — R002 Palpitations: Secondary | ICD-10-CM

## 2023-04-19 ENCOUNTER — Ambulatory Visit: Payer: No Typology Code available for payment source

## 2023-04-19 DIAGNOSIS — R002 Palpitations: Secondary | ICD-10-CM

## 2023-04-22 ENCOUNTER — Encounter: Payer: Self-pay | Admitting: Cardiology

## 2023-04-23 NOTE — Progress Notes (Signed)
Exercise treadmill stress test 04/19/2023: Exercise treadmill stress test performed using Bruce protocol.  Patient exercised for a total of 9 minutes and 14 seconds, achieving 10.5 METS, and 101% of age predicted maximum heart rate.  Exercise capacity was excellent.  No chest pain reported.  Normal heart rate and hemodynamic response. Stress EKG revealed no ischemic changes. Low risk study.

## 2023-04-24 ENCOUNTER — Encounter: Payer: Self-pay | Admitting: Cardiology

## 2023-04-24 NOTE — Telephone Encounter (Signed)
From patient.

## 2023-04-24 NOTE — Telephone Encounter (Signed)
Message has been addressed.

## 2023-04-29 NOTE — Telephone Encounter (Signed)
From patient.

## 2023-05-01 NOTE — Telephone Encounter (Signed)
done

## 2023-11-21 ENCOUNTER — Other Ambulatory Visit: Payer: Self-pay

## 2023-11-21 DIAGNOSIS — I10 Essential (primary) hypertension: Secondary | ICD-10-CM

## 2023-11-21 DIAGNOSIS — E7800 Pure hypercholesterolemia, unspecified: Secondary | ICD-10-CM

## 2023-11-21 DIAGNOSIS — R931 Abnormal findings on diagnostic imaging of heart and coronary circulation: Secondary | ICD-10-CM

## 2023-11-21 MED ORDER — AMLODIPINE BESYLATE 5 MG PO TABS
5.0000 mg | ORAL_TABLET | Freq: Every day | ORAL | 0 refills | Status: DC
Start: 2023-11-21 — End: 2024-03-11

## 2023-11-21 MED ORDER — EZETIMIBE 10 MG PO TABS
10.0000 mg | ORAL_TABLET | Freq: Every day | ORAL | 0 refills | Status: DC
Start: 2023-11-21 — End: 2024-03-11

## 2023-11-21 MED ORDER — ATORVASTATIN CALCIUM 40 MG PO TABS
40.0000 mg | ORAL_TABLET | Freq: Every day | ORAL | 0 refills | Status: DC
Start: 2023-11-21 — End: 2024-03-11

## 2023-11-21 NOTE — Addendum Note (Signed)
 Addended by: Margaret Pyle D on: 11/21/2023 03:49 PM   Modules accepted: Orders

## 2023-12-05 ENCOUNTER — Other Ambulatory Visit: Payer: Self-pay

## 2023-12-05 DIAGNOSIS — I10 Essential (primary) hypertension: Secondary | ICD-10-CM

## 2023-12-05 MED ORDER — LOSARTAN POTASSIUM-HCTZ 100-25 MG PO TABS
1.0000 | ORAL_TABLET | Freq: Every morning | ORAL | 0 refills | Status: DC
Start: 2023-12-05 — End: 2024-03-11

## 2023-12-25 ENCOUNTER — Ambulatory Visit: Payer: No Typology Code available for payment source | Admitting: Cardiology

## 2024-03-11 ENCOUNTER — Other Ambulatory Visit: Payer: Self-pay | Admitting: Cardiology

## 2024-03-11 DIAGNOSIS — E7800 Pure hypercholesterolemia, unspecified: Secondary | ICD-10-CM

## 2024-03-11 DIAGNOSIS — I10 Essential (primary) hypertension: Secondary | ICD-10-CM

## 2024-03-11 DIAGNOSIS — R931 Abnormal findings on diagnostic imaging of heart and coronary circulation: Secondary | ICD-10-CM

## 2024-04-07 ENCOUNTER — Other Ambulatory Visit: Payer: Self-pay | Admitting: Cardiology

## 2024-04-07 DIAGNOSIS — E7800 Pure hypercholesterolemia, unspecified: Secondary | ICD-10-CM

## 2024-04-07 DIAGNOSIS — I10 Essential (primary) hypertension: Secondary | ICD-10-CM

## 2024-04-07 DIAGNOSIS — R931 Abnormal findings on diagnostic imaging of heart and coronary circulation: Secondary | ICD-10-CM

## 2024-05-08 ENCOUNTER — Other Ambulatory Visit: Payer: Self-pay | Admitting: Cardiology

## 2024-05-08 DIAGNOSIS — E7800 Pure hypercholesterolemia, unspecified: Secondary | ICD-10-CM

## 2024-05-08 DIAGNOSIS — I10 Essential (primary) hypertension: Secondary | ICD-10-CM

## 2024-05-27 ENCOUNTER — Telehealth: Payer: Self-pay | Admitting: Cardiology

## 2024-05-27 DIAGNOSIS — I10 Essential (primary) hypertension: Secondary | ICD-10-CM

## 2024-05-27 DIAGNOSIS — R931 Abnormal findings on diagnostic imaging of heart and coronary circulation: Secondary | ICD-10-CM

## 2024-05-27 DIAGNOSIS — E7800 Pure hypercholesterolemia, unspecified: Secondary | ICD-10-CM

## 2024-05-27 MED ORDER — ATORVASTATIN CALCIUM 40 MG PO TABS: 40.0000 mg | ORAL_TABLET | Freq: Every day | ORAL | 0 refills | Status: AC

## 2024-05-27 MED ORDER — LOSARTAN POTASSIUM-HCTZ 100-25 MG PO TABS: 1.0000 | ORAL_TABLET | Freq: Every morning | ORAL | 0 refills | Status: DC

## 2024-05-27 MED ORDER — AMLODIPINE BESYLATE 5 MG PO TABS: 5.0000 mg | ORAL_TABLET | Freq: Every day | ORAL | 0 refills | Status: DC

## 2024-05-27 MED ORDER — EZETIMIBE 10 MG PO TABS: 10.0000 mg | ORAL_TABLET | Freq: Every day | ORAL | 0 refills | Status: DC

## 2024-05-27 NOTE — Telephone Encounter (Signed)
 RX sent in

## 2024-05-27 NOTE — Telephone Encounter (Signed)
*  STAT* If patient is at the pharmacy, call can be transferred to refill team.   1. Which medications need to be refilled? (please list name of each medication and dose if known)     amLODipine  (NORVASC ) 5 MG tablet Take 1 tablet (5 mg total) by mouth daily.   atorvastatin  (LIPITOR) 40 MG tablet Take 1 tablet (40 mg total) by mouth daily    ezetimibe  (ZETIA ) 10 MG tablet Take 1 tablet (10 mg total) by mouth daily.    losartan -hydrochlorothiazide (HYZAAR) 100-25 MG tablet Take 1 tablet by mouth every morning.   4. Which pharmacy/location (including street and city if local pharmacy) is medication to be sent to?   [email or web address] [facility] - [place], [place] - [address] [place] [address] Phone: [phone]  Fax: [phone]       5. Do they need a 30 day or 90 day supply? 90   Scheduled 12/3

## 2024-06-15 ENCOUNTER — Encounter: Payer: Self-pay | Admitting: Cardiology

## 2024-06-15 ENCOUNTER — Ambulatory Visit: Attending: Cardiology | Admitting: Cardiology

## 2024-06-15 VITALS — BP 110/72 | HR 67 | Resp 16 | Ht 70.0 in | Wt 196.2 lb

## 2024-06-15 DIAGNOSIS — R931 Abnormal findings on diagnostic imaging of heart and coronary circulation: Secondary | ICD-10-CM | POA: Diagnosis not present

## 2024-06-15 DIAGNOSIS — E7800 Pure hypercholesterolemia, unspecified: Secondary | ICD-10-CM | POA: Diagnosis not present

## 2024-06-15 DIAGNOSIS — I10 Essential (primary) hypertension: Secondary | ICD-10-CM | POA: Diagnosis not present

## 2024-06-15 NOTE — Patient Instructions (Signed)
 Medication Instructions:  No medication changes were made at this visit. Continue current regimen.   *If you need a refill on your cardiac medications before your next appointment, please call your pharmacy*  Lab Work: NONE If you have labs (blood work) drawn today and your tests are completely normal, you will receive your results only by: MyChart Message (if you have MyChart) OR A paper copy in the mail If you have any lab test that is abnormal or we need to change your treatment, we will call you to review the results.  Testing/Procedures: NONE  Follow-Up: At [facility], you and your health needs are our priority.  As part of our continuing mission to provide you with exceptional heart care, our providers are all part of one team.  This team includes your primary Cardiologist (physician) and Advanced Practice Providers or APPs (Physician Assistants and Nurse Practitioners) who all work together to provide you with the care you need, when you need it.  Your next appointment:   AS NEEDED   Provider:   Gordy Bergamo, MD    We recommend signing up for the patient portal called MyChart.  Sign up information is provided on this After Visit Summary.  MyChart is used to connect with patients for Virtual Visits (Telemedicine).  Patients are able to view lab/test results, encounter notes, upcoming appointments, etc.  Non-urgent messages can be sent to your provider as well.   To learn more about what you can do with MyChart, go to [email or web address].

## 2024-06-15 NOTE — Progress Notes (Signed)
 Cardiology Office Note:  .   Date:  06/15/2024  ID:  Dale Mooney, DOB [birth year], MRN [id number] PCP: Gretel Senior, MD  [facility] Providers Cardiologist:  Gordy Bergamo, MD   History of Present Illness: .   Dale Mooney is a 53 y.o. with hypertension, prediabetes, hyperlipidemia, and former tobacco use. He has strong family history of premature coronary disease, on the maternal side he has had multiple cousins or uncles who have had significant CAD at age <2, with multiple cardiac deaths at age <43.  He also had coronary calcium  score in the 84th percentile on 06/02/2021 and presently on aggressive medical therapy with statins.  He has had a normal echocardiogram on 11/27/2016 and exercise nuclear stress test on 11/17/2016 revealed excellent exercise tolerance heart 13.8 METS with no evidence of ischemia.  This is annual visit.  He remains asymptomatic but endorses occasional generalized fatigue and leg cramps at night.  Cardiac Studies relevent.        Discussed the use of AI scribe software for clinical note transcription with the patient, who gave verbal consent to proceed.  History of Present Illness Dale Mooney is a 53 year old male with hypertension and hyperlipidemia who presents with leg pain when lying down.  He experiences leg pain primarily at night when lying down, occasionally during the day, localized to the calves, and increasing in frequency. He is concerned about circulation issues. Hypertension and hyperlipidemia are managed with losartan  HCT, amlodipine  5 mg, and statins. He feels more fatigued than before, especially after physical activities, and exercises three times a week. Weight fluctuates between 185 and 195 pounds.  Labs   Lipoprotein (a)  Date/Time Value Ref Range Status  12/19/2022 08:40 AM 30.1 <75.0 nmol/L Final    Comment:    Note:  Values greater than or equal to 75.0 nmol/L may        indicate an independent risk factor for CHD,        but must be  evaluated with caution when applied        to non-Caucasian populations due to the        influence of genetic factors on Lp(a) across        ethnicities.     Care everywhere/Faxed External Labs:  Labs 05/30/2023:  Total cholesterol 130, platelets excellent at 83, HDL 50, LDL 51.  TSH normal at 1.47.  A1c 5.7%.  Serum glucose 92 mg, BUN 14, creatinine 1.05, eGFR 86 mL, potassium 4.2, LFTs normal.  ROS  Review of Systems  Cardiovascular:  Negative for chest pain, dyspnea on exertion and leg swelling.   Physical Exam:   VS:  BP 110/72 (BP Location: Left Arm, Patient Position: Sitting, Cuff Size: Normal)   Pulse 67   Resp 16   Ht 5' 10 (1.778 m)   Wt 196 lb 3.2 oz (89 kg)   SpO2 99%   BMI 28.15 kg/m    Wt Readings from Last 3 Encounters:  06/15/24 196 lb 3.2 oz (89 kg)  02/04/23 188 lb (85.3 kg)  12/25/22 183 lb 9.6 oz (83.3 kg)    BP Readings from Last 3 Encounters:  06/15/24 110/72  02/04/23 117/73  12/25/22 120/80   Physical Exam Neck:     Vascular: No carotid bruit or JVD.  Cardiovascular:     Rate and Rhythm: Normal rate and regular rhythm.     Pulses: Intact distal pulses.     Heart sounds: Normal heart sounds. No murmur heard.  No gallop.  Pulmonary:     Effort: Pulmonary effort is normal.     Breath sounds: Normal breath sounds.  Abdominal:     General: Bowel sounds are normal.     Palpations: Abdomen is soft.  Musculoskeletal:     Right lower leg: No edema.     Left lower leg: No edema.    EKG:    EKG Interpretation Date/Time:  Monday June 15 2024 15:12:12 EDT Ventricular Rate:  66 PR Interval:  188 QRS Duration:  92 QT Interval:  382 QTC Calculation: 400 R Axis:   45  Text Interpretation: EKG 06/15/2024: Normal sinus rhythm at rate of 66 bpm, normal EKG. Confirmed by Rodnisha Blomgren, Jagadeesh [id number]) on 06/15/2024 8:32:35 PM    ASSESSMENT AND PLAN: .      ICD-10-CM   1. Elevated coronary artery calcium  score 06/02/2021: Total Agatston score  39. MESA database percentile: 84  R93.1 EKG 12-Lead    2. Primary hypertension  I10     3. Hypercholesteremia  E78.00      Assessment & Plan Leg pain and muscle cramps Intermittent leg pain and muscle cramps, primarily nocturnal, localized to the calves. Differential includes electrolyte imbalance, statin-induced muscle cramps, or circulatory issues. Symptoms increasing in frequency. - Trial of magnesium or electrolytes. - Consider a spoon of mustard before bed. - Contact if symptoms persist or worsen. - Could consider start holiday.  Fatigue Increased fatigue, particularly post-exertion. Contributing factors may include age-related changes, decreased physical activity, and potential sleep disturbances. Nocturia and nighttime fluid intake may contribute to sleep disturbances. Differential includes sleep apnea due to frequent nocturnal awakenings. - Discuss potential sleep apnea with PCP, Dr. Elspeth Senters, if fatigue persists.  Essential hypertension Blood pressure well-controlled on losartan  HCT and amlodipine . Current reading: 110/72 mmHg. - Continue losartan  HCT and amlodipine . - Regular blood pressure monitoring.  Elevated coronary calcium  score and hypercholesterolemia Cholesterol levels previously well-controlled on statin therapy. Last LDL check a year ago at 51 mg/dL. - Follow up with PCP, Dr. Elspeth Senters, for routine blood work to monitor cholesterol levels. - Patient remains asymptomatic with regard to cardiac and vascular issues physical examination and vascular examination was normal.  Follow up: As needed.  Signed,  Gordy Bergamo, MD, [facility] 06/15/2024, 8:33 PM [facility] [address] [place], [place] [address] Phone: [phone]. Fax:  [phone]

## 2024-08-06 ENCOUNTER — Other Ambulatory Visit: Payer: Self-pay | Admitting: Cardiology

## 2024-08-06 DIAGNOSIS — E7800 Pure hypercholesterolemia, unspecified: Secondary | ICD-10-CM

## 2024-08-06 DIAGNOSIS — I10 Essential (primary) hypertension: Secondary | ICD-10-CM

## 2024-08-19 ENCOUNTER — Ambulatory Visit: Admitting: Cardiology

## 2024-09-05 ENCOUNTER — Other Ambulatory Visit: Payer: Self-pay | Admitting: Cardiology

## 2024-09-05 DIAGNOSIS — I10 Essential (primary) hypertension: Secondary | ICD-10-CM
